# Patient Record
Sex: Female | Born: 1985 | Race: Black or African American | Hispanic: Yes | Smoking: Never smoker
Health system: Southern US, Community
[De-identification: ages and names within clinical notes are randomized; demographics above are authoritative.]

## PROBLEM LIST (undated history)

## (undated) DIAGNOSIS — T783XXA Angioneurotic edema, initial encounter: Secondary | ICD-10-CM

## (undated) DIAGNOSIS — K219 Gastro-esophageal reflux disease without esophagitis: Secondary | ICD-10-CM

## (undated) DIAGNOSIS — N63 Unspecified lump in unspecified breast: Secondary | ICD-10-CM

## (undated) DIAGNOSIS — G43909 Migraine, unspecified, not intractable, without status migrainosus: Secondary | ICD-10-CM

## (undated) DIAGNOSIS — U071 COVID-19: Secondary | ICD-10-CM

## (undated) DIAGNOSIS — J45909 Unspecified asthma, uncomplicated: Secondary | ICD-10-CM

## (undated) DIAGNOSIS — Z973 Presence of spectacles and contact lenses: Secondary | ICD-10-CM

## (undated) DIAGNOSIS — R011 Cardiac murmur, unspecified: Secondary | ICD-10-CM

## (undated) HISTORY — DX: Angioneurotic edema, initial encounter: T78.3XXA

## (undated) HISTORY — DX: Unspecified lump in unspecified breast: N63.0

## (undated) HISTORY — DX: Unspecified asthma, uncomplicated: J45.909

## (undated) HISTORY — DX: Gastro-esophageal reflux disease without esophagitis: K21.9

## (undated) HISTORY — DX: Migraine, unspecified, not intractable, without status migrainosus: G43.909

---

## 2015-01-11 HISTORY — PX: APPENDECTOMY: SHX54

## 2015-08-26 ENCOUNTER — Ambulatory Visit (INDEPENDENT_AMBULATORY_CARE_PROVIDER_SITE_OTHER): Payer: Self-pay | Admitting: Family Medicine

## 2015-08-26 ENCOUNTER — Encounter: Payer: Self-pay | Admitting: Family Medicine

## 2015-08-26 VITALS — BP 110/80 | Temp 99.0°F | Wt 162.0 lb

## 2015-08-26 DIAGNOSIS — K1379 Other lesions of oral mucosa: Secondary | ICD-10-CM

## 2015-08-26 DIAGNOSIS — K1370 Unspecified lesions of oral mucosa: Secondary | ICD-10-CM | POA: Insufficient documentation

## 2015-08-26 MED ORDER — ACYCLOVIR 400 MG PO TABS: 400.0000 mg | ORAL_TABLET | Freq: Every day | ORAL | Status: DC

## 2015-08-26 MED ORDER — VALACYCLOVIR HCL 1 G PO TABS: 1000.0000 mg | ORAL_TABLET | Freq: Two times a day (BID) | ORAL | Status: DC

## 2015-08-26 NOTE — Progress Notes (Signed)
   Subjective:    Patient ID: Griselda MinerCarmen Toppin, female    DOB: 03-Nov-1985, 29 y.o.   MRN: [id number]  HPI [name] is a 29 year old married female nonsmoker who comes in today for evaluation of a painful lesion on the upper palate for 4 days  She states she felt well until Thursday evening she gets fever and chills the next day noticed some painful ulcerated lesions on the upper palate just behind her front teeth. After 24-hour the fever and chills went away the lesions persist. She's never had a problem like this in the past  She developed no other symptoms i.e. no sore throat head congestion runny nose or cough  She's had an appendectomy otherwise no major illnesses or injuries. She has no medical allergies , she takes no medications. For birth control she has a Mirena IUD       Review of Systems    otherwise negative Objective:   Physical Exam  Well-developed well-nourished female no acute distress vital signs stable she's afebrile examination the oral cavity shows some ulcerated lesions on the upper palate just behind the front teeth  Disuse cervical adenopathy      Assessment & Plan:  Painful lesions on the upper palate...Marland Kitchen.Marland Kitchen.Marland Kitchen. first episode.......Marland Kitchen. 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 culture taken,,,,,, acyclovir prescribed

## 2015-08-27 ENCOUNTER — Other Ambulatory Visit: Payer: Self-pay | Admitting: *Deleted

## 2015-08-27 MED ORDER — MAGIC MOUTHWASH W/LIDOCAINE: 5.0000 mL | ORAL | Status: DC | PRN

## 2015-08-31 LAB — RFX HSV/VARICELLA ZOSTER RAPID CULT

## 2015-08-31 LAB — REFLEX ADENOVIRUS CULTURE

## 2015-09-03 LAB — CYTOMEGALOVIRUS CULTURE

## 2015-09-03 LAB — VIRAL CULTURE VIRC

## 2015-10-16 ENCOUNTER — Other Ambulatory Visit: Payer: Self-pay | Admitting: Family Medicine

## 2015-10-16 DIAGNOSIS — Z0000 Encounter for general adult medical examination without abnormal findings: Secondary | ICD-10-CM

## 2015-10-16 DIAGNOSIS — R5383 Other fatigue: Secondary | ICD-10-CM

## 2015-10-17 ENCOUNTER — Other Ambulatory Visit (INDEPENDENT_AMBULATORY_CARE_PROVIDER_SITE_OTHER): Payer: Self-pay

## 2015-10-17 ENCOUNTER — Other Ambulatory Visit: Payer: Self-pay | Admitting: Family Medicine

## 2015-10-17 DIAGNOSIS — Z0000 Encounter for general adult medical examination without abnormal findings: Secondary | ICD-10-CM

## 2015-10-17 DIAGNOSIS — R5383 Other fatigue: Secondary | ICD-10-CM

## 2015-10-17 LAB — LIPID PANEL
Cholesterol: 169 mg/dL (ref 0–200)
HDL: 48.4 mg/dL
LDL Cholesterol: 99 mg/dL (ref 0–99)
NonHDL: 121.05
Total CHOL/HDL Ratio: 4
Triglycerides: 112 mg/dL (ref 0.0–149.0)
VLDL: 22.4 mg/dL (ref 0.0–40.0)

## 2015-10-17 LAB — BASIC METABOLIC PANEL WITH GFR
BUN: 14 mg/dL (ref 6–23)
CO2: 29 meq/L (ref 19–32)
Calcium: 9.6 mg/dL (ref 8.4–10.5)
Chloride: 102 meq/L (ref 96–112)
Creatinine, Ser: 0.78 mg/dL (ref 0.40–1.20)
GFR: 93.16 mL/min
Glucose, Bld: 94 mg/dL (ref 70–99)
Potassium: 4.4 meq/L (ref 3.5–5.1)
Sodium: 139 meq/L (ref 135–145)

## 2015-10-17 LAB — CBC WITH DIFFERENTIAL/PLATELET
Basophils Absolute: 0 K/uL (ref 0.0–0.1)
Basophils Relative: 0.4 % (ref 0.0–3.0)
Eosinophils Absolute: 0.1 K/uL (ref 0.0–0.7)
Eosinophils Relative: 1.7 % (ref 0.0–5.0)
HCT: 41 % (ref 36.0–46.0)
Hemoglobin: 13.8 g/dL (ref 12.0–15.0)
Lymphocytes Relative: 26.4 % (ref 12.0–46.0)
Lymphs Abs: 1.8 K/uL (ref 0.7–4.0)
MCHC: 33.5 g/dL (ref 30.0–36.0)
MCV: 90.4 fl (ref 78.0–100.0)
Monocytes Absolute: 0.5 K/uL (ref 0.1–1.0)
Monocytes Relative: 7 % (ref 3.0–12.0)
Neutro Abs: 4.5 K/uL (ref 1.4–7.7)
Neutrophils Relative %: 64.5 % (ref 43.0–77.0)
Platelets: 211 K/uL (ref 150.0–400.0)
RBC: 4.54 Mil/uL (ref 3.87–5.11)
RDW: 13.2 % (ref 11.5–15.5)
WBC: 7 K/uL (ref 4.0–10.5)

## 2015-10-17 LAB — POCT URINALYSIS DIPSTICK
Bilirubin, UA: NEGATIVE
Blood, UA: NEGATIVE
Glucose, UA: NEGATIVE
Ketones, UA: NEGATIVE
Leukocytes, UA: NEGATIVE
Nitrite, UA: NEGATIVE
Protein, UA: NEGATIVE
Spec Grav, UA: >=1.03
Urobilinogen, UA: 0.2
pH, UA: 5.5

## 2015-10-17 LAB — VITAMIN D 25 HYDROXY (VIT D DEFICIENCY, FRACTURES): VITD: 17.56 ng/mL — ABNORMAL LOW (ref 30.00–100.00)

## 2015-10-17 LAB — HEPATIC FUNCTION PANEL
ALK PHOS: 77 U/L (ref 39–117)
ALT: 12 U/L (ref 0–35)
AST: 14 U/L (ref 0–37)
Albumin: 4.3 g/dL (ref 3.5–5.2)
BILIRUBIN DIRECT: 0.1 mg/dL (ref 0.0–0.3)
BILIRUBIN TOTAL: 0.4 mg/dL (ref 0.2–1.2)
TOTAL PROTEIN: 7.1 g/dL (ref 6.0–8.3)

## 2015-10-17 LAB — TSH: TSH: 1.88 u[IU]/mL (ref 0.35–4.50)

## 2015-10-21 ENCOUNTER — Encounter: Payer: Self-pay | Admitting: Family Medicine

## 2015-10-21 ENCOUNTER — Ambulatory Visit (INDEPENDENT_AMBULATORY_CARE_PROVIDER_SITE_OTHER): Payer: 59 | Admitting: Family Medicine

## 2015-10-21 VITALS — BP 110/78 | Temp 98.9°F | Ht 64.0 in | Wt 162.0 lb

## 2015-10-21 DIAGNOSIS — J302 Other seasonal allergic rhinitis: Secondary | ICD-10-CM | POA: Diagnosis not present

## 2015-10-21 DIAGNOSIS — J309 Allergic rhinitis, unspecified: Secondary | ICD-10-CM | POA: Insufficient documentation

## 2015-10-21 MED ORDER — FLUTICASONE PROPIONATE 50 MCG/ACT NA SUSP: 2.0000 | Freq: Every day | NASAL | Status: DC

## 2015-10-21 NOTE — Progress Notes (Signed)
   Subjective:    Patient ID: Samantha Hartman, female    DOB: 09/01/1986, 30 y.o.   MRN: [id number]  HPI Samantha Hartman is a 30 year old female nonsmoker who comes in today for evaluation of 2 issues  Past 2 months she's had a sense of fatigue. The last 5 days she's had head congestion runny nose and sneezing. She has a Mirena IUD. LMP was a week ago. Typically she doesn't have a cycle on the Mirena but she did have one last week  She got some lab work 4 days ago. Lab work showed a normal urine CBC LFTs cholesterol thyroid and metabolic panel.     Review of Systems Review of systems otherwise negative no history of previous allergy problems in the past    Objective:   Physical Exam Well-developed well-nourished female no acute distress vital signs stable she's afebrile HEENT were negative except for marked bilateral nasal edema allergic rhinitis............Marland Kitchen.       Assessment & Plan:  Allergic rhinitis........Marland Kitchen. Zyrtec plain a bedtime and steroid nasal spray

## 2015-10-21 NOTE — Patient Instructions (Signed)
Zyrtec plain........Marland Kitchen. 1 at bedtime  Steroid nasal spray.......Marland Kitchen. 1 shot up each nostril twice daily  Follow-up with me in 2 weeks to give me a progress report

## 2015-10-21 NOTE — Progress Notes (Signed)
Pre visit review using our clinic review tool, if applicable. No additional management support is needed unless otherwise documented below in the visit note. 

## 2015-10-30 ENCOUNTER — Encounter: Payer: Self-pay | Admitting: Family Medicine

## 2015-10-30 ENCOUNTER — Ambulatory Visit (INDEPENDENT_AMBULATORY_CARE_PROVIDER_SITE_OTHER): Payer: 59 | Admitting: Family Medicine

## 2015-10-30 VITALS — BP 110/80 | Temp 98.1°F | Wt 162.0 lb

## 2015-10-30 DIAGNOSIS — N764 Abscess of vulva: Secondary | ICD-10-CM | POA: Insufficient documentation

## 2015-10-30 MED ORDER — CEPHALEXIN 500 MG PO CAPS: 500.0000 mg | ORAL_CAPSULE | Freq: Four times a day (QID) | ORAL | Status: DC

## 2015-10-30 NOTE — Progress Notes (Signed)
   Subjective:    Patient ID: Samantha Hartman, female    DOB: 05-01-1986, 30 y.o.   MRN: [id number]  HPI Mattea is a 30 year old female who comes in today for evaluation of pain swelling and redness of her left vulva  In [place] a year ago she had 3 surgical procedures 3 months apart because of her current abscess in the left vulva. It resolved and she's been symptom-free for the past year until yesterday when she noticed pain and swelling and redness and some drainage.  Review of systems otherwise negative LMP skin. 2 weeks ago she has the Mirena IUD    Review of Systems    this systems otherwise negative Objective:   Physical Exam Well-developed well-nourished female no acute distress vital signs stable she's afebrile examination vulva shows a golf ball size abscess with a area that's pointing. After an alcohol prep a small medial was used to open the abscess. Pus was expressed and cultured.       Assessment & Plan:  Vulvar abscess................ hot soaks antibiotics follow-up in one week

## 2015-10-30 NOTE — Patient Instructions (Signed)
Sits baths twice daily  Keflex 500 mg........ 2 tabs twice daily  Follow-up next Monday

## 2015-11-02 LAB — WOUND CULTURE

## 2015-11-04 ENCOUNTER — Ambulatory Visit (INDEPENDENT_AMBULATORY_CARE_PROVIDER_SITE_OTHER): Payer: 59 | Admitting: Family Medicine

## 2015-11-04 ENCOUNTER — Encounter: Payer: Self-pay | Admitting: Family Medicine

## 2015-11-04 DIAGNOSIS — N764 Abscess of vulva: Secondary | ICD-10-CM

## 2015-11-04 MED ORDER — FLUCONAZOLE 150 MG PO TABS
150.0000 mg | ORAL_TABLET | Freq: Once | ORAL | Status: DC
Start: 2015-11-04 — End: 2015-11-15

## 2015-11-04 NOTE — Patient Instructions (Signed)
Finished the antibiotics.........Marland Kitchen 1 a day at bedtime until bottle empty  Diflucan.......Marland Kitchen 1 now and repeat in 3-4 days of vaginitis persists  Return when necessary

## 2015-11-04 NOTE — Progress Notes (Signed)
   Subjective:    Patient ID: Samantha Hartman, female    DOB: 1986/06/21, 30 y.o.   MRN: [id number]  HPI Samantha Hartman is a 30 year old female nonsmoker who comes in today for follow-up of a vulvar abscess  We saw a week ago with an abscess on her left labia. She had this problem about a year ago and had 3 surgical procedures over a six-month period of time. It then recurred. When we saw her last week a little bit of pus was expressed and we started her on Keflex 500 mg 2 tabs twice a day. Within a couple days the swelling and redness diminished in the pain diminished. Continue to drain for couple more days.  Culture report shows gram-positive bacteria   Review of Systems    review of systems negative Objective:   Physical Exam Well-developed well-nourished female no acute distress vital signs stable she's afebrile examination of labia shows the area of the abscess is pretty much gone. I can feel no tenderness no pus. There are some scar tissue where she had the incision and drainage last year.       Assessment & Plan:  Left lower abscess.......Marland Kitchen Resolved.......Marland Kitchen finish Keflex 1 tablet a day at bedtime until bottle empty.......Marland Kitchen Diflucan for vaginitis  In the future do not shave with a razor.

## 2015-11-15 ENCOUNTER — Ambulatory Visit (INDEPENDENT_AMBULATORY_CARE_PROVIDER_SITE_OTHER)
Admission: RE | Admit: 2015-11-15 | Discharge: 2015-11-15 | Disposition: A | Discharge status: Home / Self Care | Payer: 59 | Source: Ambulatory Visit | Attending: Adult Health | Admitting: Adult Health

## 2015-11-15 ENCOUNTER — Ambulatory Visit (INDEPENDENT_AMBULATORY_CARE_PROVIDER_SITE_OTHER): Payer: 59 | Admitting: Adult Health

## 2015-11-15 ENCOUNTER — Encounter: Payer: Self-pay | Admitting: Adult Health

## 2015-11-15 VITALS — HR 78 | Temp 98.7°F | Ht 64.0 in | Wt 162.0 lb

## 2015-11-15 DIAGNOSIS — R8290 Unspecified abnormal findings in urine: Secondary | ICD-10-CM

## 2015-11-15 DIAGNOSIS — R079 Chest pain, unspecified: Secondary | ICD-10-CM

## 2015-11-15 DIAGNOSIS — R8299 Other abnormal findings in urine: Secondary | ICD-10-CM | POA: Diagnosis not present

## 2015-11-15 LAB — CBC
HEMATOCRIT: 41.2 % (ref 36.0–46.0)
HEMOGLOBIN: 13.8 g/dL (ref 12.0–15.0)
MCHC: 33.5 g/dL (ref 30.0–36.0)
MCV: 90.6 fl (ref 78.0–100.0)
PLATELETS: 217 10*3/uL (ref 150.0–400.0)
RBC: 4.55 Mil/uL (ref 3.87–5.11)
RDW: 13.4 % (ref 11.5–15.5)
WBC: 6.8 10*3/uL (ref 4.0–10.5)

## 2015-11-15 LAB — POCT URINALYSIS DIPSTICK
Bilirubin, UA: NEGATIVE
Blood, UA: NEGATIVE
GLUCOSE UA: NEGATIVE
KETONES UA: NEGATIVE
NITRITE UA: NEGATIVE
PH UA: 6
PROTEIN UA: NEGATIVE
SPEC GRAV UA: 1.015
UROBILINOGEN UA: 0.2

## 2015-11-15 LAB — BASIC METABOLIC PANEL
BUN: 7 mg/dL (ref 6–23)
CHLORIDE: 105 meq/L (ref 96–112)
CO2: 26 mEq/L (ref 19–32)
Calcium: 9.7 mg/dL (ref 8.4–10.5)
Creatinine, Ser: 0.71 mg/dL (ref 0.40–1.20)
GFR: 103.78 mL/min (ref >60.00)
GLUCOSE: 86 mg/dL (ref 70–99)
POTASSIUM: 4.4 meq/L (ref 3.5–5.1)
SODIUM: 138 meq/L (ref 135–145)

## 2015-11-15 LAB — POCT URINE PREGNANCY: Preg Test, Ur: NEGATIVE

## 2015-11-15 LAB — TSH: TSH: 1.24 u[IU]/mL (ref 0.35–4.50)

## 2015-11-15 LAB — TROPONIN I: TNIDX: 0 ug/l (ref 0.00–0.06)

## 2015-11-15 MED ORDER — CYCLOBENZAPRINE HCL 10 MG PO TABS: 10.0000 mg | ORAL_TABLET | Freq: Three times a day (TID) | ORAL | Status: DC | PRN

## 2015-11-15 MED ORDER — KETOROLAC TROMETHAMINE 60 MG/2ML IM SOLN
60.0000 mg | Freq: Once | INTRAMUSCULAR | Status: AC
  Administered 2015-11-15: 60 mg via INTRAMUSCULAR

## 2015-11-15 NOTE — Progress Notes (Signed)
Pre visit review using our clinic review tool, if applicable. No additional management support is needed unless otherwise documented below in the visit note. 

## 2015-11-15 NOTE — Progress Notes (Signed)
Subjective:    Patient ID: Samantha Hartman, female    DOB: [birth year], 30 y.o.   MRN: [id number]  HPI 30 year old female who presents to the office today for less than 24 hours of non specific sharp chest pain. Her chest pain started last night around 8:30 pm while laying in bed.   She reports that when she is sitting straight up the pain is a 7/10. When she lays down the pain goes up to a 10/10 and she has a hard time breathing.   When she woke up this morning she was nauseated and vomited. She continues to feel nauseated throughout the morning. She does endorse waking up with a sour taste in her mouth but has been taking Prilosec for years.  She denies any burning pain in her chest but does on occasion have pain in her esophageus.   Her only surgery is that of an appendectomy. She is on OTC allergy medication and has a Mirana IUD.    Past Medical History  Diagnosis Date  . Asthma   . GERD (gastroesophageal reflux disease)     Social History   Social History  . Marital Status: Married    Spouse Name: N/A  . Number of Children: N/A  . Years of Education: N/A   Occupational History  . Not on file.   Social History Main Topics  . Smoking status: Never Smoker   . Smokeless tobacco: Not on file  . Alcohol Use: Not on file  . Drug Use: Not on file  . Sexual Activity: Not on file   Other Topics Concern  . Not on file   Social History Narrative    Past Surgical History  Procedure Laterality Date  . Appendectomy      Family History  Problem Relation Age of Onset  . Stroke Mother   . Hypertension Mother   . Cancer Father     prostate    No Known Allergies  Current Outpatient Prescriptions on File Prior to Visit  Medication Sig Dispense Refill  . cetirizine (ZYRTEC) 10 MG tablet Take 10 mg by mouth daily.    . fluticasone (FLONASE) 50 MCG/ACT nasal spray Place 2 sprays into both nostrils daily. 32 g 6   No current facility-administered medications on file prior  to visit.    Pulse 78  Temp(Src) 98.7 F (37.1 C) (Oral)  Ht  (1.626 m)  Wt 162 lb (73.483 kg)  BMI 27.79 kg/m2  SpO2 98%    Review of Systems  Constitutional: Negative.   Respiratory: Positive for chest tightness and shortness of breath.   Cardiovascular: Positive for chest pain. Negative for palpitations and leg swelling.  Gastrointestinal: Positive for nausea and vomiting.  Neurological: Negative.   All other systems reviewed and are negative.  Past Medical History  Diagnosis Date  . Asthma   . GERD (gastroesophageal reflux disease)     Social History   Social History  . Marital Status: Married    Spouse Name: N/A  . Number of Children: N/A  . Years of Education: N/A   Occupational History  . Not on file.   Social History Main Topics  . Smoking status: Never Smoker   . Smokeless tobacco: Not on file  . Alcohol Use: Not on file  . Drug Use: Not on file  . Sexual Activity: Not on file   Other Topics Concern  . Not on file   Social History Narrative    Past Surgical  History  Procedure Laterality Date  . Appendectomy      Family History  Problem Relation Age of Onset  . Stroke Mother   . Hypertension Mother   . Cancer Father     prostate    No Known Allergies  Current Outpatient Prescriptions on File Prior to Visit  Medication Sig Dispense Refill  . cetirizine (ZYRTEC) 10 MG tablet Take 10 mg by mouth daily.    . fluticasone (FLONASE) 50 MCG/ACT nasal spray Place 2 sprays into both nostrils daily. 32 g 6   No current facility-administered medications on file prior to visit.    Pulse 78  Temp(Src) 98.7 F (37.1 C) (Oral)  Ht  (1.626 m)  Wt 162 lb (73.483 kg)  BMI 27.79 kg/m2  SpO2 98%       Objective:   Physical Exam  Constitutional: She is oriented to person, place, and time. She appears well-developed and well-nourished. No distress.  Neck: Normal range of motion. Neck supple.  Cardiovascular: Normal rate, regular  rhythm, normal heart sounds and intact distal pulses.  Exam reveals no gallop and no friction rub.   No murmur heard. EKG showed occasional ectopic ventricular beats  Pulmonary/Chest: Effort normal and breath sounds normal. No respiratory distress. She has no wheezes. She has no rales. She exhibits no tenderness.  Musculoskeletal: Normal range of motion. She exhibits no edema or tenderness.  Lymphadenopathy:    She has no cervical adenopathy.  Neurological: She is alert and oriented to person, place, and time.  Skin: Skin is warm and dry. No rash noted. She is not diaphoretic. No erythema. No pallor.  Psychiatric: She has a normal mood and affect. Her behavior is normal. Judgment and thought content normal.  Nursing note and vitals reviewed.     Assessment & Plan:  1. Chest pain, unspecified chest pain type - Differentials include GERD, PE, pneumothorax, pneumonia, costocondoritis. She does not smoke and does not take oral birth control. I doubt any cardiac issues - EKG 12-Lead - NSR with ectopic ventricular beats. Rate 82 - DG Chest 2 View; Future - Negative  - Basic metabolic panel - CBC - TSH - POCT urinalysis dipstick - trace Leuks - POCT urine pregnancy- Negative - ketorolac (TORADOL) injection 60 mg; Inject 2 mLs (60 mg total) into the muscle - cyclobenzaprine (FLEXERIL) 10 MG tablet; Take 1 tablet (10 mg total) by mouth 3 (three) times daily as needed for muscle spasms.  Dispense: 30 tablet; Refill: 1

## 2015-11-17 LAB — URINE CULTURE
Colony Count: NO GROWTH
Organism ID, Bacteria: NO GROWTH

## 2015-11-25 ENCOUNTER — Ambulatory Visit: Payer: 59 | Admitting: Family Medicine

## 2015-11-28 DIAGNOSIS — Z6827 Body mass index (BMI) 27.0-27.9, adult: Secondary | ICD-10-CM | POA: Diagnosis not present

## 2015-11-28 DIAGNOSIS — Z01419 Encounter for gynecological examination (general) (routine) without abnormal findings: Secondary | ICD-10-CM | POA: Diagnosis not present

## 2015-12-02 ENCOUNTER — Other Ambulatory Visit: Payer: Self-pay | Admitting: Obstetrics and Gynecology

## 2015-12-02 DIAGNOSIS — N644 Mastodynia: Secondary | ICD-10-CM

## 2015-12-02 DIAGNOSIS — N6452 Nipple discharge: Secondary | ICD-10-CM

## 2015-12-02 DIAGNOSIS — N63 Unspecified lump in unspecified breast: Secondary | ICD-10-CM

## 2015-12-04 ENCOUNTER — Ambulatory Visit
Admission: RE | Admit: 2015-12-04 | Discharge: 2015-12-04 | Disposition: A | Discharge status: Home / Self Care | Payer: 59 | Source: Ambulatory Visit | Attending: Obstetrics and Gynecology | Admitting: Obstetrics and Gynecology

## 2015-12-04 DIAGNOSIS — N6452 Nipple discharge: Secondary | ICD-10-CM

## 2015-12-04 DIAGNOSIS — N63 Unspecified lump in unspecified breast: Secondary | ICD-10-CM

## 2015-12-04 DIAGNOSIS — N644 Mastodynia: Secondary | ICD-10-CM

## 2015-12-04 NOTE — H&P (Signed)
  Patient name Samantha Hartman, Samantha Hartman DICTATION# [id number] CSN#  [id number]  [facility], MD 12/04/2015 8:26 AM

## 2015-12-04 NOTE — H&P (Signed)
NAME:  Samantha Hartman, GRANGE NO.:  [id number]  MEDICAL RECORD NO.:  [id number]  LOCATION:                                 FACILITY:  PHYSICIAN:  Juluis Mire, M.D.   DATE OF BIRTH:  [birth year]  DATE OF ADMISSION:  12/19/2015 DATE OF DISCHARGE:                             HISTORY & PHYSICAL   DATE OF SURGERY:  March 9th at [facility], [place].  HISTORY OF PRESENT ILLNESS:  The patient is a 30 year old, gravida 3, para 2, divorced female, who presents for permanent sterilization.  She had an IUD inserted in 2014 is basically amenorrheic.  She is desirous of permanent sterilization at this point in time, who presents for laparoscopic bilateral tubal ligation.  ALLERGIES:  In terms of allergies, she has no known drug allergies.  MEDICATIONS:  None.  PAST MEDICAL HISTORY:  Usual childhood diseases.  No significant sequelae.  She has had 2 vaginal deliveries.  SOCIAL HISTORY:  Reveals no tobacco or alcohol use.  FAMILY HISTORY:  Noncontributory.  REVIEW OF SYSTEMS:  Noncontributory.  PHYSICAL EXAMINATION:  VITAL SIGNS:  The patient is afebrile.  Stable vital signs. HEENT:  The patient is normocephalic.  Pupils equal, round, and reactive to light and accommodation.  Extraocular movements were intact.  Sclerae and conjunctivae were clear.  Oropharynx was clear. NECK:  Without thyromegaly. BREASTS:  Not examined. LUNGS:  Clear. CARDIOVASCULAR:  Regular rate without murmurs or gallops.  No carotid or abdominal bruits. ABDOMEN:  Benign.  No mass, organomegaly, or tenderness. PELVIC:  Normal external genitalia.  Vaginal mucosa is clear.  Cervix unremarkable.  Uterus normal size, shape, and contour.  Adnexa free of mass or tenderness. EXTREMITIES:  Trace edema. NEUROLOGIC:  Gross normal limits.  IMPRESSION:  Multiparity desires sterility.  PLAN:  The patient will undergo laparoscopic bilateral tubal fulguration.  The  potential irreversibility of sterilization was explained.  A failure rate of 1 in 200 is quoted.  Failures can be in the form of ectopic pregnancy, requiring further surgical management. The risks of surgery have been explained including the risk of infection. The risk of hemorrhage that could require transfusion with the risk of AIDS or hepatitis.  Risk of injury to adjacent organs such as bladder, bowel, or ureters that could require further exploratory surgery.  Risk of deep venous thrombosis and pulmonary embolus.  The patient does understand the indications, risks, and alternatives.     Juluis Mire, M.D.     JSM/MEDQ  D:  12/04/2015  T:  12/04/2015  Job:  [id number]

## 2015-12-12 ENCOUNTER — Encounter (HOSPITAL_BASED_OUTPATIENT_CLINIC_OR_DEPARTMENT_OTHER): Payer: Self-pay | Admitting: *Deleted

## 2015-12-12 ENCOUNTER — Other Ambulatory Visit (INDEPENDENT_AMBULATORY_CARE_PROVIDER_SITE_OTHER): Payer: 59

## 2015-12-12 DIAGNOSIS — E349 Endocrine disorder, unspecified: Secondary | ICD-10-CM

## 2015-12-12 DIAGNOSIS — D649 Anemia, unspecified: Secondary | ICD-10-CM

## 2015-12-12 LAB — CBC WITH DIFFERENTIAL/PLATELET
BASOS ABS: 0 10*3/uL (ref 0.0–0.1)
BASOS PCT: 0.4 % (ref 0.0–3.0)
Eosinophils Absolute: 0.1 10*3/uL (ref 0.0–0.7)
Eosinophils Relative: 1.4 % (ref 0.0–5.0)
HCT: 38.8 % (ref 36.0–46.0)
Hemoglobin: 13.3 g/dL (ref 12.0–15.0)
LYMPHS ABS: 2.3 10*3/uL (ref 0.7–4.0)
Lymphocytes Relative: 34 % (ref 12.0–46.0)
MCHC: 34.2 g/dL (ref 30.0–36.0)
MCV: 89 fl (ref 78.0–100.0)
MONOS PCT: 7 % (ref 3.0–12.0)
Monocytes Absolute: 0.5 10*3/uL (ref 0.1–1.0)
NEUTROS ABS: 3.9 10*3/uL (ref 1.4–7.7)
NEUTROS PCT: 57.2 % (ref 43.0–77.0)
PLATELETS: 207 10*3/uL (ref 150.0–400.0)
RBC: 4.36 Mil/uL (ref 3.87–5.11)
RDW: 13.1 % (ref 11.5–15.5)
WBC: 6.8 10*3/uL (ref 4.0–10.5)

## 2015-12-12 NOTE — Progress Notes (Addendum)
NPO AFTER MN.  ARRIVE AT 0600.  PT WORKS AT [facility], SHE WILL BACK TO ME IF  SHE CAN LAB WORK DONE THERE.  RECEIVED CALL BACK FROM PT, AND SHE HAD LAB WORK DONE TODAY.  RESULTS IN CHART AND EPIC.

## 2015-12-13 LAB — HCG, SERUM, QUALITATIVE: Preg, Serum: NEGATIVE

## 2015-12-16 DIAGNOSIS — Z302 Encounter for sterilization: Secondary | ICD-10-CM | POA: Diagnosis not present

## 2015-12-16 DIAGNOSIS — N63 Unspecified lump in breast: Secondary | ICD-10-CM | POA: Diagnosis not present

## 2015-12-16 DIAGNOSIS — Z01818 Encounter for other preprocedural examination: Secondary | ICD-10-CM | POA: Diagnosis not present

## 2015-12-19 ENCOUNTER — Ambulatory Visit (HOSPITAL_BASED_OUTPATIENT_CLINIC_OR_DEPARTMENT_OTHER): Payer: 59 | Admitting: Anesthesiology

## 2015-12-19 ENCOUNTER — Encounter (HOSPITAL_BASED_OUTPATIENT_CLINIC_OR_DEPARTMENT_OTHER)
Admission: RE | Disposition: A | Discharge status: Home / Self Care | Payer: Self-pay | Source: Ambulatory Visit | Attending: Obstetrics and Gynecology

## 2015-12-19 ENCOUNTER — Encounter (HOSPITAL_BASED_OUTPATIENT_CLINIC_OR_DEPARTMENT_OTHER): Payer: Self-pay | Admitting: *Deleted

## 2015-12-19 ENCOUNTER — Ambulatory Visit (HOSPITAL_BASED_OUTPATIENT_CLINIC_OR_DEPARTMENT_OTHER)
Admission: RE | Admit: 2015-12-19 | Discharge: 2015-12-19 | Disposition: A | Discharge status: Home / Self Care | Payer: 59 | Source: Ambulatory Visit | Attending: Obstetrics and Gynecology | Admitting: Obstetrics and Gynecology

## 2015-12-19 DIAGNOSIS — Z302 Encounter for sterilization: Secondary | ICD-10-CM | POA: Diagnosis not present

## 2015-12-19 DIAGNOSIS — Z975 Presence of (intrauterine) contraceptive device: Secondary | ICD-10-CM | POA: Diagnosis not present

## 2015-12-19 HISTORY — DX: Presence of spectacles and contact lenses: Z97.3

## 2015-12-19 HISTORY — PX: LAPAROSCOPIC TUBAL LIGATION: SHX1937

## 2015-12-19 SURGERY — LIGATION, FALLOPIAN TUBE, LAPAROSCOPIC
Anesthesia: General | Laterality: Bilateral

## 2015-12-19 MED ORDER — CEFAZOLIN SODIUM-DEXTROSE 2-3 GM-% IV SOLR
INTRAVENOUS | Status: AC
  Filled 2015-12-19: qty 50

## 2015-12-19 MED ORDER — FENTANYL CITRATE (PF) 100 MCG/2ML IJ SOLN
INTRAMUSCULAR | Status: AC
  Filled 2015-12-19: qty 2

## 2015-12-19 MED ORDER — MIDAZOLAM HCL 2 MG/2ML IJ SOLN
INTRAMUSCULAR | Status: AC
  Filled 2015-12-19: qty 2

## 2015-12-19 MED ORDER — PROMETHAZINE HCL 25 MG/ML IJ SOLN
6.2500 mg | INTRAMUSCULAR | Status: DC | PRN
  Filled 2015-12-19: qty 1

## 2015-12-19 MED ORDER — GLYCOPYRROLATE 0.2 MG/ML IJ SOLN
INTRAMUSCULAR | Status: DC | PRN
  Administered 2015-12-19: 0.4 mg via INTRAVENOUS

## 2015-12-19 MED ORDER — ONDANSETRON HCL 4 MG/2ML IJ SOLN
INTRAMUSCULAR | Status: DC | PRN
  Administered 2015-12-19: 4 mg via INTRAVENOUS

## 2015-12-19 MED ORDER — WHITE PETROLATUM GEL
Status: AC
Start: 2015-12-19 — End: 2015-12-19
  Filled 2015-12-19: qty 5

## 2015-12-19 MED ORDER — CEFAZOLIN SODIUM-DEXTROSE 2-3 GM-% IV SOLR
2.0000 g | INTRAVENOUS | Status: AC
  Administered 2015-12-19: 2 g via INTRAVENOUS
  Filled 2015-12-19: qty 50

## 2015-12-19 MED ORDER — KETOROLAC TROMETHAMINE 30 MG/ML IJ SOLN
INTRAMUSCULAR | Status: AC
  Filled 2015-12-19: qty 1

## 2015-12-19 MED ORDER — SUCCINYLCHOLINE CHLORIDE 20 MG/ML IJ SOLN
INTRAMUSCULAR | Status: DC | PRN
  Administered 2015-12-19: 100 mg via INTRAVENOUS

## 2015-12-19 MED ORDER — SUGAMMADEX SODIUM 200 MG/2ML IV SOLN
INTRAVENOUS | Status: DC | PRN
  Administered 2015-12-19 (×2): 100 mg via INTRAVENOUS

## 2015-12-19 MED ORDER — OXYCODONE-ACETAMINOPHEN 7.5-325 MG PO TABS: 1.0000 | ORAL_TABLET | ORAL | Status: DC | PRN

## 2015-12-19 MED ORDER — ROCURONIUM BROMIDE 100 MG/10ML IV SOLN
INTRAVENOUS | Status: AC
  Filled 2015-12-19: qty 1

## 2015-12-19 MED ORDER — SUGAMMADEX SODIUM 200 MG/2ML IV SOLN
INTRAVENOUS | Status: AC
  Filled 2015-12-19: qty 4

## 2015-12-19 MED ORDER — DEXAMETHASONE SODIUM PHOSPHATE 4 MG/ML IJ SOLN
INTRAMUSCULAR | Status: DC | PRN
  Administered 2015-12-19: 10 mg via INTRAVENOUS

## 2015-12-19 MED ORDER — MIDAZOLAM HCL 5 MG/5ML IJ SOLN
INTRAMUSCULAR | Status: DC | PRN
  Administered 2015-12-19: 2 mg via INTRAVENOUS

## 2015-12-19 MED ORDER — FENTANYL CITRATE (PF) 100 MCG/2ML IJ SOLN
25.0000 ug | INTRAMUSCULAR | Status: DC | PRN
  Administered 2015-12-19: 50 ug via INTRAVENOUS
  Filled 2015-12-19: qty 1

## 2015-12-19 MED ORDER — NEOSTIGMINE METHYLSULFATE 10 MG/10ML IV SOLN
INTRAVENOUS | Status: DC | PRN
  Administered 2015-12-19: 3 mg via INTRAVENOUS

## 2015-12-19 MED ORDER — LIDOCAINE HCL (CARDIAC) 20 MG/ML IV SOLN
INTRAVENOUS | Status: AC
  Filled 2015-12-19: qty 5

## 2015-12-19 MED ORDER — FENTANYL CITRATE (PF) 100 MCG/2ML IJ SOLN
INTRAMUSCULAR | Status: DC | PRN
  Administered 2015-12-19 (×2): 25 ug via INTRAVENOUS
  Administered 2015-12-19: 50 ug via INTRAVENOUS

## 2015-12-19 MED ORDER — LIDOCAINE HCL (CARDIAC) 20 MG/ML IV SOLN
INTRAVENOUS | Status: DC | PRN
  Administered 2015-12-19: 60 mg via INTRAVENOUS

## 2015-12-19 MED ORDER — PROPOFOL 10 MG/ML IV BOLUS
INTRAVENOUS | Status: AC
  Filled 2015-12-19: qty 20

## 2015-12-19 MED ORDER — GLYCOPYRROLATE 0.2 MG/ML IJ SOLN
INTRAMUSCULAR | Status: AC
  Filled 2015-12-19: qty 2

## 2015-12-19 MED ORDER — PROPOFOL 10 MG/ML IV BOLUS
INTRAVENOUS | Status: DC | PRN
  Administered 2015-12-19: 200 mg via INTRAVENOUS

## 2015-12-19 MED ORDER — KETOROLAC TROMETHAMINE 30 MG/ML IJ SOLN
INTRAMUSCULAR | Status: DC | PRN
  Administered 2015-12-19: 30 mg via INTRAVENOUS

## 2015-12-19 MED ORDER — DEXAMETHASONE SODIUM PHOSPHATE 10 MG/ML IJ SOLN
INTRAMUSCULAR | Status: AC
  Filled 2015-12-19: qty 1

## 2015-12-19 MED ORDER — LACTATED RINGERS IV SOLN
INTRAVENOUS | Status: DC
  Administered 2015-12-19 (×2): via INTRAVENOUS
  Filled 2015-12-19: qty 1000

## 2015-12-19 MED ORDER — ONDANSETRON HCL 4 MG/2ML IJ SOLN
INTRAMUSCULAR | Status: AC
  Filled 2015-12-19: qty 2

## 2015-12-19 MED ORDER — ROCURONIUM BROMIDE 100 MG/10ML IV SOLN
INTRAVENOUS | Status: DC | PRN
  Administered 2015-12-19: 20 mg via INTRAVENOUS

## 2015-12-19 MED ORDER — NEOSTIGMINE METHYLSULFATE 10 MG/10ML IV SOLN
INTRAVENOUS | Status: AC
  Filled 2015-12-19: qty 1

## 2015-12-19 MED ORDER — BUPIVACAINE HCL 0.25 % IJ SOLN
INTRAMUSCULAR | Status: DC | PRN
  Administered 2015-12-19: 2 mL

## 2015-12-19 SURGICAL SUPPLY — 48 items
APPLICATOR COTTON TIP 6IN STRL (MISCELLANEOUS) ×3 IMPLANT
BANDAGE ADH SHEER 1  50/CT (GAUZE/BANDAGES/DRESSINGS) ×3 IMPLANT
BANDAGE ADHESIVE 1X3 (GAUZE/BANDAGES/DRESSINGS) IMPLANT
BLADE SURG 11 STRL SS (BLADE) ×3 IMPLANT
CANISTER SUCTION 1200CC (MISCELLANEOUS) IMPLANT
CANISTER SUCTION 2500CC (MISCELLANEOUS) IMPLANT
CATH ROBINSON RED A/P 16FR (CATHETERS) ×3 IMPLANT
COVER MAYO STAND STRL (DRAPES) ×3 IMPLANT
DRAPE UNDERBUTTOCKS STRL (DRAPE) ×3 IMPLANT
ELECT REM PT RETURN 9FT ADLT (ELECTROSURGICAL) ×3
ELECTRODE REM PT RTRN 9FT ADLT (ELECTROSURGICAL) ×1 IMPLANT
FILTER SMOKE EVAC LAPAROSHD (FILTER) IMPLANT
GLOVE SURG SS PI 7.0 STRL IVOR (GLOVE) ×6 IMPLANT
GOWN STRL REUS W/TWL XL LVL3 (GOWN DISPOSABLE) ×6 IMPLANT
KIT ROOM TURNOVER WOR (KITS) ×3 IMPLANT
LEGGING LITHOTOMY PAIR STRL (DRAPES) ×3 IMPLANT
LIQUID BAND (GAUZE/BANDAGES/DRESSINGS) ×3 IMPLANT
NEEDLE HYPO 25X1 1.5 SAFETY (NEEDLE) ×3 IMPLANT
NEEDLE INSUFFLATION 14GA 120MM (NEEDLE) IMPLANT
NS IRRIG 500ML POUR BTL (IV SOLUTION) ×3 IMPLANT
PACK BASIN DAY SURGERY FS (CUSTOM PROCEDURE TRAY) ×3 IMPLANT
PACK LAPAROSCOPY II (CUSTOM PROCEDURE TRAY) ×3 IMPLANT
PAD OB MATERNITY 4.3X12.25 (PERSONAL CARE ITEMS) ×3 IMPLANT
PAD PREP 24X48 CUFFED NSTRL (MISCELLANEOUS) ×3 IMPLANT
POUCH SPECIMEN RETRIEVAL 10MM (ENDOMECHANICALS) IMPLANT
SCISSORS LAP 5X35 DISP (ENDOMECHANICALS) IMPLANT
SCISSORS LAP 5X45 EPIX DISP (ENDOMECHANICALS) IMPLANT
SEALER TISSUE G2 CVD JAW 35 (ENDOMECHANICALS) IMPLANT
SEALER TISSUE G2 CVD JAW 45CM (ENDOMECHANICALS) IMPLANT
SET IRRIG TUBING LAPAROSCOPIC (IRRIGATION / IRRIGATOR) IMPLANT
SOLUTION ANTI FOG 6CC (MISCELLANEOUS) ×3 IMPLANT
SUT VIC AB 3-0 PS2 18 (SUTURE) ×2
SUT VIC AB 3-0 PS2 18XBRD (SUTURE) ×1 IMPLANT
SUT VICRYL 0 ENDOLOOP (SUTURE) IMPLANT
SUT VICRYL 0 UR6 27IN ABS (SUTURE) IMPLANT
SUT VICRYL 4-0 PS2 18IN ABS (SUTURE) IMPLANT
SYR CONTROL 10ML LL (SYRINGE) ×3 IMPLANT
SYRINGE 10CC LL (SYRINGE) IMPLANT
TOWEL OR 17X24 6PK STRL BLUE (TOWEL DISPOSABLE) ×6 IMPLANT
TRAY DSU PREP LF (CUSTOM PROCEDURE TRAY) ×3 IMPLANT
TROCAR BALLN 12MMX100 BLUNT (TROCAR) IMPLANT
TROCAR OPTI TIP 5M 100M (ENDOMECHANICALS) ×3 IMPLANT
TROCAR XCEL DIL TIP R 11M (ENDOMECHANICALS) IMPLANT
TUBE CONNECTING 12'X1/4 (SUCTIONS)
TUBE CONNECTING 12X1/4 (SUCTIONS) IMPLANT
TUBING INSUFFLATION 10FT LAP (TUBING) ×3 IMPLANT
WARMER LAPAROSCOPE (MISCELLANEOUS) ×3 IMPLANT
WATER STERILE IRR 500ML POUR (IV SOLUTION) ×3 IMPLANT

## 2015-12-19 NOTE — Anesthesia Postprocedure Evaluation (Signed)
Anesthesia Post Note  Patient: Samantha Hartman  Procedure(s) Performed: Procedure(s) (LRB): LAPAROSCOPIC BILATERAL TUBAL LIGATION (Bilateral)  Patient location during evaluation: PACU Anesthesia Type: General Level of consciousness: awake and alert Pain management: pain level controlled Vital Signs Assessment: post-procedure vital signs reviewed and stable Respiratory status: spontaneous breathing, nonlabored ventilation, respiratory function stable and patient connected to nasal cannula oxygen Cardiovascular status: blood pressure returned to baseline and stable Postop Assessment: no signs of nausea or vomiting Anesthetic complications: no Comments: Initially had episode of persistent muscle weakness clinically following extubation even though had 4 twitches on TOF monitoring.. suggamadex given with good clinical effect.. Looks great post operatively    Last Vitals:  Filed Vitals:   12/19/15 0830 12/19/15 0845  BP: 103/70 106/68  Pulse: 60 59  Temp:    Resp: 11 17    Last Pain:  Filed Vitals:   12/19/15 0853  PainSc: 6                  Reino KentJudd, Oralee Rapaport J

## 2015-12-19 NOTE — Discharge Instructions (Signed)
Laparoscopic Tubal Ligation, Care After °Refer to this sheet in the next few weeks. These instructions provide you with information about caring for yourself after your procedure. Your health care provider may also give you more specific instructions. Your treatment has been planned according to current medical practices, but problems sometimes occur. Call your health care provider if you have any problems or questions after your procedure. °WHAT TO EXPECT AFTER THE PROCEDURE °After your procedure, it is common to have: °· Sore throat. °· Soreness at the incision site. °· Mild cramping. °· Tiredness. °· Mild nausea or vomiting. °· Shoulder pain. °HOME CARE INSTRUCTIONS °· Rest for the remainder of the day. °· Take medicines only as directed by your health care provider. These include over-the-counter medicines and prescription medicines. Do not take aspirin, which can cause bleeding. °· Over the next few days, gradually return to your normal activities and your normal diet. °· Avoid sexual intercourse for 2 weeks or as directed by your health care provider. °· Do not use tampons, and do not douche. °· Do not drive or operate heavy machinery while taking pain medicine. °· Do not lift anything that is heavier than 5 lb (2.3 kg) for 2 weeks or as directed by your health care provider. °· Do not take baths. Take showers only. Ask your health care provider when you can start taking baths. °· Take your temperature twice each day and write it down. °· Try to have help for your household needs for the first 7-10 days. °· There are many different ways to close and cover an incision, including stitches (sutures), skin glue, and adhesive strips. Follow instructions from your health care provider about: °¨ Incision care. °¨ Bandage (dressing) changes and removal. °¨ Incision closure removal. °· Check your incision area every day for signs of infection. Watch for: °¨ Redness, swelling, or pain. °¨ Fluid, blood, or pus. °· Keep  all follow-up visits as directed by your health care provider. °SEEK MEDICAL CARE IF: °· You have redness, swelling, or increasing pain in your incision area. °· You have fluid, blood, or pus coming from your incision for longer than 1 day. °· You notice a bad smell coming from your incision or your dressing. °· The edges of your incision break open after the sutures have been removed. °· Your pain does not decrease after 2-3 days. °· You have a rash. °· You repeatedly become dizzy or light-headed. °· You have a reaction to your medicine. °· Your pain medicine is not helping. °· You are constipated. °SEEK IMMEDIATE MEDICAL CARE IF: °· You have a fever. °· You faint. °· You have increasing pain in your abdomen. °· You have severe pain in one or both of your shoulders. °· You have bleeding or drainage from your suture sites or your vagina after surgery. °· You have shortness of breath or have difficulty breathing. °· You have chest pain or leg pain. °· You have ongoing nausea, vomiting, or diarrhea. °  °This information is not intended to replace advice given to you by your health care provider. Make sure you discuss any questions you have with your health care provider. °  °Document Released: 04/17/2005 Document Revised: 02/12/2015 Document Reviewed: 01/09/2012 °Elsevier Interactive Patient Education ©2016 [employer] ° °

## 2015-12-19 NOTE — Transfer of Care (Signed)
Immediate Anesthesia Transfer of Care Note  Patient: Samantha Hartman  Procedure(s) Performed: Procedure(s) (LRB): LAPAROSCOPIC BILATERAL TUBAL LIGATION (Bilateral)  Patient Location: PACU  Anesthesia Type: General  Level of Consciousness: awake, oriented, sedated and patient cooperative  Airway & Oxygen Therapy: Patient Spontanous Breathing and Patient connected to face mask oxygen  Post-op Assessment: Pt c/o/shortness of breath - 100mg  bridion IV, pt with strong grip, TOF4/4. Report given to PACU RN and Post -op Vital signs reviewed and stable  Post vital signs: Reviewed and stable, HR - 56, BP - 113/88, Sat - 100%, Resp - 14  Complications: No apparent anesthesia complications

## 2015-12-19 NOTE — Anesthesia Preprocedure Evaluation (Signed)
Anesthesia Evaluation  Patient identified by MRN, date of birth, ID band Patient awake    Reviewed: Allergy & Precautions, H&P , NPO status , Patient's Chart, lab work & pertinent test results  History of Anesthesia Complications Negative for: history of anesthetic complications  Airway Mallampati: II  TM Distance: >3 FB Neck ROM: full    Dental no notable dental hx.    Pulmonary neg pulmonary ROS,    Pulmonary exam normal breath sounds clear to auscultation       Cardiovascular negative cardio ROS Normal cardiovascular exam Rhythm:regular Rate:Normal     Neuro/Psych negative neurological ROS     GI/Hepatic Neg liver ROS, GERD  ,  Endo/Other  negative endocrine ROS  Renal/GU negative Renal ROS     Musculoskeletal   Abdominal   Peds  Hematology negative hematology ROS (+)   Anesthesia Other Findings   Reproductive/Obstetrics negative OB ROS                             Anesthesia Physical Anesthesia Plan  ASA: II  Anesthesia Plan: General   Post-op Pain Management:    Induction: Intravenous  Airway Management Planned: Oral ETT  Additional Equipment:   Intra-op Plan:   Post-operative Plan: Extubation in OR  Informed Consent: I have reviewed the patients History and Physical, chart, labs and discussed the procedure including the risks, benefits and alternatives for the proposed anesthesia with the patient or authorized representative who has indicated his/her understanding and acceptance.   Dental Advisory Given  Plan Discussed with: Anesthesiologist, CRNA and Surgeon  Anesthesia Plan Comments:         Anesthesia Quick Evaluation  

## 2015-12-19 NOTE — Anesthesia Procedure Notes (Signed)
Procedure Name: Intubation Date/Time: 12/19/2015 7:21 AM Performed by: Renella CunasHAZEL, Semira Stoltzfus D Pre-anesthesia Checklist: Patient identified, Emergency Drugs available, Suction available and Patient being monitored Patient Re-evaluated:Patient Re-evaluated prior to inductionOxygen Delivery Method: Circle System Utilized Preoxygenation: Pre-oxygenation with 100% oxygen Intubation Type: IV induction Ventilation: Mask ventilation without difficulty Laryngoscope Size: Mac and 3 Grade View: Grade I Tube type: Oral Tube size: 7.0 mm Number of attempts: 1 Airway Equipment and Method: Stylet and Oral airway Placement Confirmation: ETT inserted through vocal cords under direct vision,  positive ETCO2 and breath sounds checked- equal and bilateral Secured at: 22 cm Tube secured with: Tape Dental Injury: Teeth and Oropharynx as per pre-operative assessment

## 2015-12-19 NOTE — Brief Op Note (Signed)
12/19/2015  7:44 AM  PATIENT:  Samantha Hartman  30 y.o. female  PRE-OPERATIVE DIAGNOSIS:  DESIRES STERILITY  POST-OPERATIVE DIAGNOSIS:  DESIRES STERILITY  PROCEDURE:  Procedure(s): LAPAROSCOPIC BILATERAL TUBAL LIGATION (Bilateral)  SURGEON:  Surgeon(s) and Role:    * Richardean ChimeraJohn Courtenay Hirth, MD - Primary  PHYSICIAN ASSISTANT:   ASSISTANTS: none   ANESTHESIA:   local and general  EBL:  Total I/O In: -  Out: 10 [Blood:10]  BLOOD ADMINISTERED:none  DRAINS: none   LOCAL MEDICATIONS USED:  MARCAINE     SPECIMEN:  No Specimen  DISPOSITION OF SPECIMEN:  N/A  COUNTS:  YES  TOURNIQUET:  * No tourniquets in log *  DICTATION: .Other Dictation: Dictation Number [id number]  PLAN OF CARE: Discharge to home after PACU  PATIENT DISPOSITION:  PACU - hemodynamically stable.   Delay start of Pharmacological VTE agent (>24hrs) due to surgical blood loss or risk of bleeding: not applicable

## 2015-12-19 NOTE — Op Note (Signed)
NAME:  Samantha Hartman, Samantha Hartman              ACCOUNT NO.:  [id number]  MEDICAL RECORD NO.:  [id number]  LOCATION:                                 FACILITY:  PHYSICIAN:  Juluis MireJohn S. Yasheka Fossett, M.D.        DATE OF BIRTH:  DATE OF PROCEDURE:  12/19/2015 DATE OF DISCHARGE:                              OPERATIVE REPORT   PREOPERATIVE DIAGNOSIS:  Multiparity desires sterility.  Intrauterine device in place.  POSTOPERATIVE DIAGNOSIS:  Multiparity desires sterility.  Intrauterine device in place.  OPERATIVE PROCEDURE:  Removal of intrauterine device.  Laparoscopic bilateral tubal fulguration.  SURGEON:  Juluis MireJohn S. Jameire Kouba, M.D.  ANESTHESIA:  General.  ESTIMATED BLOOD LOSS:  Minimal.  PACKS AND DRAINS:  None.  INTRAOPERATIVE BLOOD PLACEMENT:  None.  COMPLICATIONS:  None.  INDICATION:  Dictated in history and physical.  PROCEDURE IN DETAIL:  The patient was taken to the OR and placed in supine position.  After satisfactory level of general endotracheal anesthesia was obtained, the patient was placed in dorsal lithotomy position using Allen stirrups.  The abdomen, perineum, and vagina were prepped out with Betadine.  Bladder was emptied by in and out of catheterization.  A speculum was placed in the vaginal vault.  Previous IUD was removed intact.  The anterior lip of the cervix was secured with a single-tooth tenaculum.  The Hulka tenaculum then put in place and secured.  Single-tooth tenaculum was inspected and removed.  The patient then draped as a sterile field.  Subumbilical incision made with knife. A Veress needle was introduced into the abdominal cavity.  Abdomen inflated with approximately 3 L of carbon dioxide.  A 10/11 trocar was inserted.  Laparoscope was introduced.  No evidence of injury to adjacent organs.  Upper abdomen including the liver and tip of the gallbladder and both lateral gutters were clear.  The patient was placed in Trendelenburg position.  Uterus, tubes, and ovaries  were unremarkable.  Mid segment of each tube was cauterized for a distance of 3 cm.  The cautery was continued until resistance read 0.  The same segment was re-cauterized completely desiccating the tube.  In the procedure, both tubes have been adequately coagulated.  At this point in time, the laparoscope was removed, abdomen was deflated with carbon dioxide.  The trocars were removed.  Subumbilical incision closed with interrupted subcuticular sutures of 3-0 Vicryl.  The Hulka tenaculum removed.  The patient was taken out of dorsal lithotomy position.  Once alert, extubated, transferred to recovery room in good condition.  Sponge, instrument, and needle count was reported as correct by circulating nurse x2.     Juluis MireJohn S. Seth Friedlander, M.D.     JSM/MEDQ  D:  12/19/2015  T:  12/19/2015  Job:  [id number]

## 2015-12-20 ENCOUNTER — Encounter (HOSPITAL_BASED_OUTPATIENT_CLINIC_OR_DEPARTMENT_OTHER): Payer: Self-pay | Admitting: Obstetrics and Gynecology

## 2015-12-24 DIAGNOSIS — H5203 Hypermetropia, bilateral: Secondary | ICD-10-CM | POA: Diagnosis not present

## 2016-01-21 ENCOUNTER — Encounter: Payer: Self-pay | Admitting: Family Medicine

## 2016-01-21 ENCOUNTER — Ambulatory Visit: Payer: 59 | Admitting: Family Medicine

## 2016-01-21 VITALS — BP 120/80 | HR 78 | Temp 98.4°F | Wt 162.0 lb

## 2016-01-21 DIAGNOSIS — J45901 Unspecified asthma with (acute) exacerbation: Secondary | ICD-10-CM | POA: Insufficient documentation

## 2016-01-21 DIAGNOSIS — J4521 Mild intermittent asthma with (acute) exacerbation: Secondary | ICD-10-CM

## 2016-01-21 MED ORDER — ALBUTEROL SULFATE HFA 108 (90 BASE) MCG/ACT IN AERS: 2.0000 | INHALATION_SPRAY | Freq: Four times a day (QID) | RESPIRATORY_TRACT | Status: DC | PRN

## 2016-01-21 MED ORDER — PREDNISONE 20 MG PO TABS: ORAL_TABLET | ORAL | Status: DC

## 2016-01-21 NOTE — Patient Instructions (Addendum)
Prednisone 20 mg.......... 2 tabs now....... then 2 tabs daily until your symptoms markedly diminished........ then taper as outlined.......Marland Kitchen. 1 tab for 3 days....Marland Kitchen. A half a tab for 3 days....... than a half a tab Monday Wednesday Friday for a two-week taper  Albuterol........... 2 puffs every 6 hours when necessary  Return on Monday for follow-up

## 2016-01-21 NOTE — Progress Notes (Signed)
   Subjective:    Patient ID: Samantha Hartman, female    DOB: Oct 07, 1986, 30 y.o.   MRN: [id number]  HPI Samantha Hartman is a 30 year old married female nonsmoker who comes in today for evaluation of asthma  [place] a new to [place] she developed some spring allergies with head congestion runny nose for the eyes etc. and then last night she started wheezing. Said she had wheezed in many years. Both her children have asthma. She is to shots of albuterol which helped and a Benadryl she slept all night.   Review of Systems  Review of systems otherwise negative    Objective:   Physical Exam Well-developed well-nourished female no acute distress vital signs stable she's afebrile respiratory rate 12 and unlabored  HEENT were negative neck was supple no adenopathy lungs are clear except for some mild symmetrical wheezing on forced expiration       Assessment & Plan:  Mild asthma,,,,,,,,,,,,,,,,,,,, prednisone burst and taper,,,, return on Monday for follow-up,

## 2016-01-21 NOTE — Progress Notes (Signed)
Pre visit review using our clinic review tool, if applicable. No additional management support is needed unless otherwise documented below in the visit note. 

## 2016-01-27 ENCOUNTER — Ambulatory Visit (INDEPENDENT_AMBULATORY_CARE_PROVIDER_SITE_OTHER): Payer: 59 | Admitting: Family Medicine

## 2016-01-27 ENCOUNTER — Encounter: Payer: Self-pay | Admitting: Family Medicine

## 2016-01-27 VITALS — BP 120/80 | HR 91 | Temp 98.6°F | Wt 163.5 lb

## 2016-01-27 DIAGNOSIS — J4521 Mild intermittent asthma with (acute) exacerbation: Secondary | ICD-10-CM

## 2016-01-27 MED ORDER — MONTELUKAST SODIUM 10 MG PO TABS: 10.0000 mg | ORAL_TABLET | Freq: Every day | ORAL | Status: DC

## 2016-01-27 NOTE — Patient Instructions (Signed)
Prednisone 20 mg............. one tablet now............... starting tomorrow morning take 2 tabs for 3 days......... then Friday drop down to 1 a day  Singulair 10 mg.......Marland Kitchen. 1 at bedtime  Albuterol..........Marland Kitchen. 1-2 puffs every 6-8 hours when necessary  Follow-up in one week

## 2016-01-27 NOTE — Progress Notes (Signed)
   Subjective:    Patient ID: Samantha Hartman, female    DOB: 07-09-1986, 30 y.o.   MRN: [id number]  HPI Comment is a 30 year old married female nonsmoker who comes in today for follow-up of allergic rhinitis and asthma  We saw her last week with a flare up of allergic rhinitis which then turned and asthma. We started on albuterol 2 puffs twice a day along with prednisone 40 mg daily. After 3 days prednisone she felt much better than on Saturday TO 20 mg a day and her symptoms flare. Also she noticed her eyes are swollen and itchy still.  No side effects from prednisone   Review of Systems review of systems otherwise    Objective:   Physical Exam  well-developed well-nourished female no acute distress vital signs stable she's afebrile pulmonary exam shows symmetrical breath sounds mild late expiratory wheezing on forced expiration        Assessment & Plan:  Asthma unresolved,,,,,,,,,,,,,,,,,, add Singulair,,,,,,,, increased prednisone to 40 mg a day for 3 days then begin to taper again follow-up in one week,

## 2016-02-03 ENCOUNTER — Ambulatory Visit (INDEPENDENT_AMBULATORY_CARE_PROVIDER_SITE_OTHER): Payer: 59 | Admitting: Family Medicine

## 2016-02-03 ENCOUNTER — Encounter: Payer: Self-pay | Admitting: Family Medicine

## 2016-02-03 VITALS — BP 120/80 | Temp 98.1°F

## 2016-02-03 DIAGNOSIS — N6320 Unspecified lump in the left breast, unspecified quadrant: Secondary | ICD-10-CM | POA: Insufficient documentation

## 2016-02-03 DIAGNOSIS — J4521 Mild intermittent asthma with (acute) exacerbation: Secondary | ICD-10-CM

## 2016-02-03 DIAGNOSIS — N63 Unspecified lump in breast: Secondary | ICD-10-CM

## 2016-02-03 MED ORDER — FLUTICASONE PROPIONATE 50 MCG/ACT NA SUSP: 2.0000 | Freq: Every day | NASAL | Status: DC

## 2016-02-03 NOTE — Progress Notes (Signed)
Pre visit review using our clinic review tool, if applicable. No additional management support is needed unless otherwise documented below in the visit note. 

## 2016-02-03 NOTE — Progress Notes (Signed)
   Subjective:    Patient ID: Griselda MinerCarmen Hossain, female    DOB: November 02, 1985, 30 y.o.   MRN: [id number]  HPI [name] is a 30 year old married female nonsmoker who comes in today for follow-up of asthma and a new problem of a left breast mass  We've tapered her prednisone she's down to half a tablet a day for the last 4 days. She feels normal the sneezing and coughing and wheezing is completely stopped. She continues to Zyrtec and steroid nasal spray.  She's had soreness in her left breast in the past. This past winter it occurred lasted for about a month and went away. We felt a cystic type lesion 2:00 left breast about an inch and a half from the nipple at that time.  Her period is due today. 3 days ago she began having soreness in that left breast. Her mother has a history of breast cyst no cancer   Review of Systems    review of systems negative............ she's had a BTL Objective:   Physical Exam Well-developed well-nourished female no acute distress vital signs stable she's afebrile  Lungs are clear  Breast exam shows a cystic lesion left breast 2:00 an inch and a half nipple same area that we noticed the one before. It's about the size of a pea........ soft rubbery movable and tender.       Assessment & Plan:  Asthma resolving........ taper prednisone  Allergic rhinitis continue Zyrtec and steroid nasal spray........ add Singulair if needed  Cystic lesion left breast............ do BSE monthly........Marland Kitchen. return if any increases or size or if you have any concerns

## 2016-02-03 NOTE — Patient Instructions (Addendum)
Taper the prednisone as follows........... half a tab Monday Wednesday Friday for a two-week taper  Continue the Zyrtec plain........ one tablet daily  Continue the steroid nasal spray.......Marland Kitchen. 1 shot up each nostril at bedtime  If the Zyrtec and Singulair did not control your symptoms,,,,,,,, we will start Singulair 10 mg daily  Do a thorough breast exam about a week after your........ the cystic lesion is about an inch and half from the nipple at the 2:00 position left breast. If you feel like it's getting bigger or you have any concerns let us know

## 2016-02-21 ENCOUNTER — Other Ambulatory Visit: Payer: Self-pay | Admitting: *Deleted

## 2016-02-21 MED ORDER — FLUCONAZOLE 150 MG PO TABS: ORAL_TABLET | ORAL | Status: DC

## 2016-03-20 ENCOUNTER — Other Ambulatory Visit: Payer: Self-pay | Admitting: Family

## 2016-03-20 DIAGNOSIS — N6325 Unspecified lump in the left breast, overlapping quadrants: Secondary | ICD-10-CM

## 2016-03-20 DIAGNOSIS — N6320 Unspecified lump in the left breast, unspecified quadrant: Principal | ICD-10-CM

## 2016-03-23 ENCOUNTER — Ambulatory Visit
Admission: RE | Admit: 2016-03-23 | Discharge: 2016-03-23 | Disposition: A | Discharge status: Home / Self Care | Payer: BC Managed Care – PPO | Source: Ambulatory Visit | Attending: Family | Admitting: Family

## 2016-03-23 DIAGNOSIS — N6325 Unspecified lump in the left breast, overlapping quadrants: Secondary | ICD-10-CM

## 2016-03-23 DIAGNOSIS — N6320 Unspecified lump in the left breast, unspecified quadrant: Principal | ICD-10-CM

## 2016-05-08 ENCOUNTER — Encounter: Payer: Self-pay | Admitting: Adult Health

## 2016-05-08 ENCOUNTER — Ambulatory Visit (INDEPENDENT_AMBULATORY_CARE_PROVIDER_SITE_OTHER): Payer: BC Managed Care – PPO | Admitting: Adult Health

## 2016-05-08 VITALS — BP 106/72 | Ht 64.0 in | Wt 159.8 lb

## 2016-05-08 DIAGNOSIS — G43019 Migraine without aura, intractable, without status migrainosus: Secondary | ICD-10-CM | POA: Diagnosis not present

## 2016-05-08 DIAGNOSIS — L700 Acne vulgaris: Secondary | ICD-10-CM | POA: Diagnosis not present

## 2016-05-08 MED ORDER — KETOROLAC TROMETHAMINE 60 MG/2ML IM SOLN
60.0000 mg | Freq: Once | INTRAMUSCULAR | Status: AC
  Administered 2016-05-08: 60 mg via INTRAMUSCULAR

## 2016-05-08 MED ORDER — DOXYCYCLINE HYCLATE 100 MG PO CAPS: 100.0000 mg | ORAL_CAPSULE | Freq: Every day | ORAL | 3 refills | Status: DC

## 2016-05-08 MED ORDER — PROMETHAZINE HCL 25 MG PO TABS: 25.0000 mg | ORAL_TABLET | Freq: Four times a day (QID) | ORAL | 0 refills | Status: DC | PRN

## 2016-05-08 MED ORDER — SUMATRIPTAN SUCCINATE 50 MG PO TABS: 50.0000 mg | ORAL_TABLET | ORAL | 4 refills | Status: DC | PRN

## 2016-05-08 NOTE — Progress Notes (Signed)
   Subjective:    Patient ID: Samantha Hartman, female    DOB: Jan 27, 1986, 30 y.o.   MRN: [id number]  HPI  30 year old female who presents to the office today with the complaint of a migraine for the last three days. She has a history of migraines and she feels as though this is a typical migraine for her just worse. The migraine is located on the left temporal region. She denies any aura. Has been nauseated and has photophobia.   She has tried excedrin but denies any relief.   She would also like to go back on a course of doxycycline for her acne.   Review of Systems  Constitutional: Positive for activity change and appetite change. Negative for fever.  Eyes: Positive for photophobia. Negative for visual disturbance.  Respiratory: Negative.   Cardiovascular: Negative.   Gastrointestinal: Positive for nausea. Negative for abdominal pain, diarrhea and vomiting.  Musculoskeletal: Negative.   Neurological: Positive for headaches. Negative for dizziness.  Hematological: Negative.   Psychiatric/Behavioral: Negative.   All other systems reviewed and are negative.      Objective:   Physical Exam  Constitutional: She is oriented to person, place, and time. She appears well-developed and well-nourished. No distress.  HENT:  Head: Normocephalic and atraumatic.  Right Ear: External ear normal.  Left Ear: External ear normal.  Nose: Nose normal.  Mouth/Throat: Oropharynx is clear and moist. No oropharyngeal exudate.  Eyes: Conjunctivae and EOM are normal. Pupils are equal, round, and reactive to light. Right eye exhibits no discharge.  Neck: Normal range of motion. Neck supple.  Cardiovascular: Normal rate, regular rhythm, normal heart sounds and intact distal pulses.  Exam reveals no gallop and no friction rub.   No murmur heard. Pulmonary/Chest: Effort normal and breath sounds normal. No respiratory distress. She has no wheezes. She has no rales. She exhibits no tenderness.    Lymphadenopathy:    She has no cervical adenopathy.  Neurological: She is alert and oriented to person, place, and time.  Skin: Skin is warm and dry. No rash noted. She is not diaphoretic. No erythema. No pallor.  Cystic acne scattered throughout face  Psychiatric: She has a normal mood and affect. Her behavior is normal. Judgment and thought content normal.  Nursing note and vitals reviewed.      Assessment & Plan:  1. 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 May repeat in 2 hours if headache persists or recurs.  Dispense: 10 tablet; Refill: 4 - promethazine (PHENERGAN) 25 MG tablet; Take 1 tablet (25 mg total) by mouth every 6 (six) hours as needed for nausea or vomiting.  Dispense: 30 tablet; Refill: 0 - ketorolac (TORADOL) injection 60 mg; Inject 2 mLs (60 mg total) into the muscle once. - Follow up if no improvement   2. Acne vulgaris - doxycycline (VIBRAMYCIN) 100 MG capsule; Take 1 capsule (100 mg total) by mouth daily.  Dispense: 30 capsule; Refill: 3

## 2016-05-08 NOTE — Patient Instructions (Addendum)
It was great seeing you today .   I have sent in a prescription for Imitrex. Take as directed  I have also sent in a prescription for Phenergyn for nausea and Doxycycline for your skin.   Please let me know if you are not feeling any better     Migraine Headache A migraine headache is an intense, throbbing pain on one or both sides of your head. A migraine can last for 30 minutes to several hours. CAUSES  The exact cause of a migraine headache is not always known. However, a migraine may be caused when nerves in the brain become irritated and release chemicals that cause inflammation. This causes pain. Certain things may also trigger migraines, such as:  Alcohol.  Smoking.  Stress.  Menstruation.  Aged cheeses.  Foods or drinks that contain nitrates, glutamate, aspartame, or tyramine.  Lack of sleep.  Chocolate.  Caffeine.  Hunger.  Physical exertion.  Fatigue.  Medicines used to treat chest pain (nitroglycerine), birth control pills, estrogen, and some blood pressure medicines. SIGNS AND SYMPTOMS  Pain on one or both sides of your head.  Pulsating or throbbing pain.  Severe pain that prevents daily activities.  Pain that is aggravated by any physical activity.  Nausea, vomiting, or both.  Dizziness.  Pain with exposure to bright lights, loud noises, or activity.  General sensitivity to bright lights, loud noises, or smells. Before you get a migraine, you may get warning signs that a migraine is coming (aura). An aura may include:  Seeing flashing lights.  Seeing bright spots, halos, or zigzag lines.  Having tunnel vision or blurred vision.  Having feelings of numbness or tingling.  Having trouble talking.  Having muscle weakness. DIAGNOSIS  A migraine headache is often diagnosed based on:  Symptoms.  Physical exam.  A CT scan or MRI of your head. These imaging tests cannot diagnose migraines, but they can help rule out other causes of  headaches. TREATMENT Medicines may be given for pain and nausea. Medicines can also be given to help prevent recurrent migraines.  HOME CARE INSTRUCTIONS  Only take over-the-counter or prescription medicines for pain or discomfort as directed by your health care provider. The use of long-term narcotics is not recommended.  Lie down in a dark, quiet room when you have a migraine.  Keep a journal to find out what may trigger your migraine headaches. For example, write down:  What you eat and drink.  How much sleep you get.  Any change to your diet or medicines.  Limit alcohol consumption.  Quit smoking if you smoke.  Get 7-9 hours of sleep, or as recommended by your health care provider.  Limit stress.  Keep lights dim if bright lights bother you and make your migraines worse. SEEK IMMEDIATE MEDICAL CARE IF:   Your migraine becomes severe.  You have a fever.  You have a stiff neck.  You have vision loss.  You have muscular weakness or loss of muscle control.  You start losing your balance or have trouble walking.  You feel faint or pass out.  You have severe symptoms that are different from your first symptoms. MAKE SURE YOU:   Understand these instructions.  Will watch your condition.  Will get help right away if you are not doing well or get worse.   This information is not intended to replace advice given to you by your health care provider. Make sure you discuss any questions you have with your health care  provider.   Document Released: 09/28/2005 Document Revised: 10/19/2014 Document Reviewed: 06/05/2013 Elsevier Interactive Patient Education [employer].

## 2016-05-18 ENCOUNTER — Other Ambulatory Visit: Payer: Self-pay | Admitting: Adult Health

## 2016-05-18 ENCOUNTER — Telehealth: Payer: Self-pay | Admitting: Adult Health

## 2016-05-18 MED ORDER — ERYTHROMYCIN 5 MG/GM OP OINT: 1.0000 "application " | TOPICAL_OINTMENT | Freq: Four times a day (QID) | OPHTHALMIC | 1 refills | Status: AC

## 2016-05-18 NOTE — Telephone Encounter (Signed)
Patient calls complaining of itchy " like sand paper", red eyes that have been matted shut when she wakes up in the morning. She reports that she has discharge from her eyes throughout the day that " looks like greenish pus".   Denies any sick contacts or exposure to conjuntivitis.

## 2016-05-28 ENCOUNTER — Telehealth: Payer: Self-pay

## 2016-05-28 ENCOUNTER — Other Ambulatory Visit: Payer: Self-pay

## 2016-05-28 MED ORDER — FLUCONAZOLE 150 MG PO TABS: 150.0000 mg | ORAL_TABLET | Freq: Once | ORAL | 1 refills | Status: AC

## 2016-05-28 MED ORDER — FLUCONAZOLE 150 MG PO TABS: 150.0000 mg | ORAL_TABLET | Freq: Once | ORAL | 1 refills | Status: DC

## 2016-05-28 NOTE — Telephone Encounter (Signed)
Patient would like diflucan sent to [facility]. Please advise.

## 2016-05-28 NOTE — Telephone Encounter (Signed)
Rx sent 

## 2016-05-28 NOTE — Telephone Encounter (Signed)
Ok to send one dose of Diflucan with one additional refill

## 2016-06-08 ENCOUNTER — Encounter: Payer: Self-pay | Admitting: Adult Health

## 2016-06-09 ENCOUNTER — Other Ambulatory Visit: Payer: Self-pay | Admitting: Adult Health

## 2016-06-09 DIAGNOSIS — G43819 Other migraine, intractable, without status migrainosus: Secondary | ICD-10-CM

## 2016-07-16 ENCOUNTER — Ambulatory Visit (INDEPENDENT_AMBULATORY_CARE_PROVIDER_SITE_OTHER): Payer: BC Managed Care – PPO | Admitting: Neurology

## 2016-07-16 ENCOUNTER — Encounter: Payer: Self-pay | Admitting: Neurology

## 2016-07-16 VITALS — BP 120/76 | HR 92 | Ht 64.0 in | Wt 157.0 lb

## 2016-07-16 DIAGNOSIS — G43009 Migraine without aura, not intractable, without status migrainosus: Secondary | ICD-10-CM

## 2016-07-16 MED ORDER — TOPIRAMATE ER 50 MG PO CAP24: 1.0000 | ORAL_CAPSULE | Freq: Every day | ORAL | 2 refills | Status: DC

## 2016-07-16 MED ORDER — NAPROXEN 500 MG PO TABS: 500.0000 mg | ORAL_TABLET | Freq: Two times a day (BID) | ORAL | 2 refills | Status: DC | PRN

## 2016-07-16 MED ORDER — SUMATRIPTAN SUCCINATE 100 MG PO TABS: ORAL_TABLET | ORAL | 2 refills | Status: DC

## 2016-07-16 NOTE — Patient Instructions (Signed)
Migraine Recommendations: 1.  Start Trokendi XR 50mg  at bedtime..  Call in 4 weeks with update and we can adjust dose if needed.  Possible side effects include: impaired thinking, sedation, paresthesias (numbness and tingling) and weight loss.  It may cause dehydration and there is a small risk for kidney stones, so make sure to stay hydrated with water during the day.  There is also a very small risk for glaucoma, so if you notice any change in your vision while taking this medication, see an ophthalmologist.  There is also a very small risk of possible suicidal ideation, as it the case with all antiepileptic medications.  2.  Take sumatriptan 100mg  with or without naproxen 500mg  at earliest onset of headache.  May repeat dose with or without naproxen once in 2 hours if needed.  Do not exceed two doses in 24 hours. 3.  Limit use of pain relievers to no more than 2 days out of the week.  These medications include acetaminophen, ibuprofen, triptans and narcotics.  This will help reduce risk of rebound headaches. 4.  Be aware of common food triggers such as processed sweets, processed foods with nitrites (such as deli meat, hot dogs, sausages), foods with MSG, alcohol (such as wine), chocolate, certain cheeses, certain fruits (dried fruits, some citrus fruit), vinegar, diet soda. 4.  Avoid caffeine 5.  Routine exercise 6.  Proper sleep hygiene 7.  Stay adequately hydrated with water 8.  Keep a headache diary. 9.  Maintain proper stress management. 10.  Do not skip meals. 11.  Consider supplements:  Magnesium oxide 400mg  to 600mg  daily, riboflavin 400mg , Coenzyme Q 10 100mg  three times daily 12.  Follow up in 3 months but CONTACT US IN 4 WEEKS WITH UPDATE.   Migraine Headache A migraine headache is an intense, throbbing pain on one or both sides of your head. A migraine can last for 30 minutes to several hours. CAUSES  The exact cause of a migraine headache is not always known. However, a migraine  may be caused when nerves in the brain become irritated and release chemicals that cause inflammation. This causes pain. Certain things may also trigger migraines, such as:  Alcohol.  Smoking.  Stress.  Menstruation.  Aged cheeses.  Foods or drinks that contain nitrates, glutamate, aspartame, or tyramine.  Lack of sleep.  Chocolate.  Caffeine.  Hunger.  Physical exertion.  Fatigue.  Medicines used to treat chest pain (nitroglycerine), birth control pills, estrogen, and some blood pressure medicines. SIGNS AND SYMPTOMS  Pain on one or both sides of your head.  Pulsating or throbbing pain.  Severe pain that prevents daily activities.  Pain that is aggravated by any physical activity.  Nausea, vomiting, or both.  Dizziness.  Pain with exposure to bright lights, loud noises, or activity.  General sensitivity to bright lights, loud noises, or smells. Before you get a migraine, you may get warning signs that a migraine is coming (aura). An aura may include:  Seeing flashing lights.  Seeing bright spots, halos, or zigzag lines.  Having tunnel vision or blurred vision.  Having feelings of numbness or tingling.  Having trouble talking.  Having muscle weakness. DIAGNOSIS  A migraine headache is often diagnosed based on:  Symptoms.  Physical exam.  A CT scan or MRI of your head. These imaging tests cannot diagnose migraines, but they can help rule out other causes of headaches. TREATMENT Medicines may be given for pain and nausea. Medicines can also be given to help  prevent recurrent migraines.  HOME CARE INSTRUCTIONS  Only take over-the-counter or prescription medicines for pain or discomfort as directed by your health care provider. The use of long-term narcotics is not recommended.  Lie down in a dark, quiet room when you have a migraine.  Keep a journal to find out what may trigger your migraine headaches. For example, write down:  What you eat and  drink.  How much sleep you get.  Any change to your diet or medicines.  Limit alcohol consumption.  Quit smoking if you smoke.  Get 7-9 hours of sleep, or as recommended by your health care provider.  Limit stress.  Keep lights dim if bright lights bother you and make your migraines worse. SEEK IMMEDIATE MEDICAL CARE IF:   Your migraine becomes severe.  You have a fever.  You have a stiff neck.  You have vision loss.  You have muscular weakness or loss of muscle control.  You start losing your balance or have trouble walking.  You feel faint or pass out.  You have severe symptoms that are different from your first symptoms. MAKE SURE YOU:   Understand these instructions.  Will watch your condition.  Will get help right away if you are not doing well or get worse.   This information is not intended to replace advice given to you by your health care provider. Make sure you discuss any questions you have with your health care provider.   Document Released: 09/28/2005 Document Revised: 10/19/2014 Document Reviewed: 06/05/2013 Elsevier Interactive Patient Education [employer]

## 2016-07-16 NOTE — Progress Notes (Signed)
NEUROLOGY CONSULTATION NOTE  Samantha MinerCarmen Darrough MRN: [id number] DOB: 02-09-1986  Referring provider: Shirline Freesory Nafziger, NP Primary care provider: Shirline Freesory Nafziger, NP  Reason for consult:  migraine  HISTORY OF PRESENT ILLNESS: Samantha MinerCarmen Mcwethy is a 30 year old right-handed female with asthma and status post tubal ligation who presents for migraines.  History obtained by patient and PCP note.  Onset:  30 years old Location:  Varies:  Unilateral (either side), back of head/neck, retro-orbital Quality:  Squeezing, throbbing, burning in head Intensity:  10/10 Aura:  no Prodrome:  no Associated symptoms:  Nausea, vomiting, photophobia, phonophobia, blurred vision.  One time she had tingling in the left arm. Duration:  4-5 hours  Frequency:  2 to 3 days per week Triggers/exacerbating factors:  perfumes Relieving factors:  nothing Activity:  Cannot function about once a month  Past NSAIDS:  no Past analgesics:  Excedrin Past abortive triptans:  no Past muscle relaxants:  no Past anti-emetic:  no Past antihypertensive medications:  no Past antidepressant medications:  no Past anticonvulsant medications:  no Past vitamins/Herbal/Supplements:  no Other past therapy:  no  Current NSAIDS:  ibuprofen Current analgesics:  no Current triptans:  sumatriptan 50mg  Current anti-emetic:  promethazine 25mg  Current muscle relaxants:  no Current anti-anxiolytic:  no Current sleep aide:  no Current Antihypertensive medications:  no Current Antidepressant medications:  no Current Anticonvulsant medications:  no Current Vitamins/Herbal/Supplements:  no Current Antihistamines/Decongestants:  Xyzal, Flonase Other therapy:  no  Caffeine:  Coffee sometimes Alcohol:  no Smoker:  no Diet:  Hydrates, no processed food Exercise:  no Depression/stress:  no Sleep hygiene:  good Family history of headache:  No  Renal function from 11/15/15:  BUN 7, Cr 0.71, GFR 103.78  PAST MEDICAL HISTORY: Past  Medical History:  Diagnosis Date  . Asthma   . GERD (gastroesophageal reflux disease)   . Wears glasses     PAST SURGICAL HISTORY: Past Surgical History:  Procedure Laterality Date  . APPENDECTOMY  April 2016  . LAPAROSCOPIC TUBAL LIGATION Bilateral 12/19/2015   Procedure: LAPAROSCOPIC BILATERAL TUBAL LIGATION;  Surgeon: Richardean ChimeraJohn McComb, MD;  [facility] [place];  Service: Gynecology;  Laterality: Bilateral;    MEDICATIONS: Current Outpatient Prescriptions on File Prior to Visit  Medication Sig Dispense Refill  . albuterol (PROVENTIL HFA;VENTOLIN HFA) 108 (90 Base) MCG/ACT inhaler Inhale 2 puffs into the lungs every 6 (six) hours as needed for wheezing or shortness of breath. 1 Inhaler 5  . ALBUTEROL IN Inhale into the lungs as needed. Reported on 02/03/2016    . calcium carbonate (TUMS - DOSED IN MG ELEMENTAL CALCIUM) 500 MG chewable tablet Chew 1 tablet by mouth as needed for indigestion or heartburn. Reported on 02/03/2016    . doxycycline (VIBRAMYCIN) 100 MG capsule Take 1 capsule (100 mg total) by mouth daily. 30 capsule 3  . fluticasone (FLONASE) 50 MCG/ACT nasal spray Place 2 sprays into both nostrils daily. 32 g 6  . montelukast (SINGULAIR) 10 MG tablet Take 1 tablet (10 mg total) by mouth at bedtime. 100 tablet 3  . promethazine (PHENERGAN) 25 MG tablet Take 1 tablet (25 mg total) by mouth every 6 (six) hours as needed for nausea or vomiting. 30 tablet 0   No current facility-administered medications on file prior to visit.     ALLERGIES: Allergies  Allergen Reactions  . Other Itching and Swelling    APPLES, PINEAPPLE, KIWI  Cause itchy throat and mouth swells    FAMILY HISTORY: Family History  Problem Relation Age of Onset  . Stroke Mother   . Hypertension Mother   . Cancer Father     prostate    SOCIAL HISTORY: Social History   Social History  . Marital status: Married    Spouse name: N/A  . Number of children: N/A  . Years of education: N/A    Occupational History  . Not on file.   Social History Main Topics  . Smoking status: Never Smoker  . Smokeless tobacco: Never Used  . Alcohol use No  . Drug use: No  . Sexual activity: Yes    Birth control/ protection: IUD     Comment: 2014- placement Mirena IUD   Other Topics Concern  . Not on file   Social History Narrative  . No narrative on file    REVIEW OF SYSTEMS: Constitutional: No fevers, chills, or sweats, no generalized fatigue, change in appetite Eyes: No visual changes, double vision, eye pain Ear, nose and throat: No hearing loss, ear pain, nasal congestion, sore throat Cardiovascular: No chest pain, palpitations Respiratory:  No shortness of breath at rest or with exertion, wheezes GastrointestinaI: No nausea, vomiting, diarrhea, abdominal pain, fecal incontinence Genitourinary:  No dysuria, urinary retention or frequency Musculoskeletal:  No neck pain, back pain Integumentary: No rash, pruritus, skin lesions Neurological: as above Psychiatric: No depression, insomnia, anxiety Endocrine: No palpitations, fatigue, diaphoresis, mood swings, change in appetite, change in weight, increased thirst Hematologic/Lymphatic:  No purpura, petechiae. Allergic/Immunologic: no itchy/runny eyes, nasal congestion, recent allergic reactions, rashes  PHYSICAL EXAM: Vitals:   07/16/16 0957  BP: 120/76  Pulse: 92   General: No acute distress.  Patient appears well-groomed.  Head:  Normocephalic/atraumatic Eyes:  fundi examined but not visualized Neck: supple, no paraspinal tenderness, full range of motion Back: No paraspinal tenderness Heart: regular rate and rhythm Lungs: Clear to auscultation bilaterally. Vascular: No carotid bruits. Neurological Exam: Mental status: alert and oriented to person, place, and time, recent and remote memory intact, fund of knowledge intact, attention and concentration intact, speech fluent and not dysarthric, language intact. Cranial  nerves: CN I: not tested CN II: pupils equal, round and reactive to light, visual fields intact CN III, IV, VI:  full range of motion, no nystagmus, no ptosis CN V: facial sensation intact CN VII: upper and lower face symmetric CN VIII: hearing intact CN IX, X: gag intact, uvula midline CN XI: sternocleidomastoid and trapezius muscles intact CN XII: tongue midline Bulk & Tone: normal, no fasciculations. Motor:  5/5 throughout  Sensation: temperature and vibration sensation intact. Deep Tendon Reflexes:  2+ throughout, toes downgoing.  Finger to nose testing:  Without dysmetria.  Heel to shin:  Without dysmetria.  Gait:  Normal station and stride.  Able to turn and tandem walk. Romberg negative.  IMPRESSION: Migraine without aura  PLAN: 1.  Start Trokendi XR 50mg  at bedtime.  Side effects discussed.  Contact us in 4 weeks with update. 2. For abortive therapy, will increase sumatriptan to 100mg  and to take with naproxen 500mg .  Continue promethazine 25mg  for nausea.  Stop ibuprofen. 3.  Lifestyle modification:  Exercise, consider supplements (Mg, riboflavin, CoQ10) 4.  Follow up in 3 months.  Thank you for allowing me to take part in the care of this patient.  Shon Millet, DO  CC:  Shirline Frees, NP

## 2016-07-18 ENCOUNTER — Encounter: Payer: Self-pay | Admitting: Neurology

## 2016-07-19 ENCOUNTER — Encounter: Payer: Self-pay | Admitting: Neurology

## 2016-07-20 ENCOUNTER — Telehealth: Payer: Self-pay

## 2016-07-20 ENCOUNTER — Ambulatory Visit (INDEPENDENT_AMBULATORY_CARE_PROVIDER_SITE_OTHER): Payer: BC Managed Care – PPO

## 2016-07-20 DIAGNOSIS — G43009 Migraine without aura, not intractable, without status migrainosus: Secondary | ICD-10-CM

## 2016-07-20 MED ORDER — DIPHENHYDRAMINE HCL 50 MG/ML IJ SOLN
25.0000 mg | Freq: Once | INTRAMUSCULAR | Status: AC
  Administered 2016-07-20: 25 mg via INTRAMUSCULAR

## 2016-07-20 MED ORDER — METOCLOPRAMIDE HCL 5 MG/ML IJ SOLN
10.0000 mg | Freq: Once | INTRAMUSCULAR | Status: AC
  Administered 2016-07-20: 10 mg via INTRAMUSCULAR

## 2016-07-20 MED ORDER — SUMATRIPTAN 20 MG/ACT NA SOLN: NASAL | 2 refills | Status: DC

## 2016-07-20 MED ORDER — SODIUM CHLORIDE 0.9 % IV SOLN: 25.0000 mg | INTRAVENOUS | Status: DC | PRN

## 2016-07-20 MED ORDER — KETOROLAC TROMETHAMINE 60 MG/2ML IM SOLN
60.0000 mg | Freq: Once | INTRAMUSCULAR | Status: AC
  Administered 2016-07-20: 60 mg via INTRAMUSCULAR

## 2016-07-20 NOTE — Telephone Encounter (Signed)
First mychart message from pt, "Good afternoon Dr Everlena CooperJaffe today is been a really bad day. I waked up with numbness in my fingers and a bad headache. I been throwing up and I took my 2 douses of imitrex and naproxen."  Please advise.

## 2016-07-20 NOTE — Addendum Note (Signed)
Addended by: Sheilah MinsFOX, JADA A on: 07/20/2016 11:47 AM   Modules accepted: Orders

## 2016-07-20 NOTE — Telephone Encounter (Signed)
After pt came in for headache cocktail she sent another [employer] message.   "Hi Samantha Hartman thank you so much. Can you please ask Dr Everlena CooperJaffe if he considered an MRI or a Cast Can?"  Did advise pt that it can be difficult to get approval for patients with migraines, and therefor may not be able to be preformed even with provider ordering exam. Please advise.

## 2016-07-26 ENCOUNTER — Encounter: Payer: Self-pay | Admitting: Neurology

## 2016-07-27 NOTE — Telephone Encounter (Signed)
Please see message from patient

## 2016-07-31 ENCOUNTER — Encounter: Payer: Self-pay | Admitting: Neurology

## 2016-07-31 NOTE — Telephone Encounter (Signed)
Please see message from patient

## 2016-08-03 ENCOUNTER — Telehealth: Payer: Self-pay

## 2016-08-03 MED ORDER — TOPIRAMATE ER 50 MG PO CAP24: 100.0000 mg | ORAL_CAPSULE | Freq: Every day | ORAL | 2 refills | Status: DC

## 2016-08-03 NOTE — Telephone Encounter (Signed)
She can increase Trokendi to 100mg  at bedtime.

## 2016-08-03 NOTE — Telephone Encounter (Signed)
Rx sent to pharmacy. Mychart reply sent to pt.

## 2016-08-03 NOTE — Telephone Encounter (Signed)
Please see mychart message.

## 2016-08-05 ENCOUNTER — Encounter: Payer: Self-pay | Admitting: Adult Health

## 2016-08-05 ENCOUNTER — Ambulatory Visit: Payer: BC Managed Care – PPO

## 2016-08-05 ENCOUNTER — Ambulatory Visit (INDEPENDENT_AMBULATORY_CARE_PROVIDER_SITE_OTHER): Payer: BC Managed Care – PPO | Admitting: Adult Health

## 2016-08-05 VITALS — BP 102/64 | Ht 64.0 in | Wt 154.8 lb

## 2016-08-05 DIAGNOSIS — G43011 Migraine without aura, intractable, with status migrainosus: Secondary | ICD-10-CM

## 2016-08-05 MED ORDER — AMITRIPTYLINE HCL 25 MG PO TABS: 25.0000 mg | ORAL_TABLET | Freq: Every day | ORAL | 1 refills | Status: DC

## 2016-08-05 MED ORDER — METHYLPREDNISOLONE ACETATE 80 MG/ML IJ SUSP
80.0000 mg | Freq: Once | INTRAMUSCULAR | Status: AC
  Administered 2016-08-05: 80 mg via INTRAMUSCULAR

## 2016-08-05 MED ORDER — ALPRAZOLAM 0.5 MG PO TABS: ORAL_TABLET | ORAL | 0 refills | Status: DC

## 2016-08-05 MED ORDER — KETOROLAC TROMETHAMINE 60 MG/2ML IM SOLN
60.0000 mg | Freq: Once | INTRAMUSCULAR | Status: AC
  Administered 2016-08-05: 60 mg via INTRAMUSCULAR

## 2016-08-05 MED ORDER — AMITRIPTYLINE HCL 10 MG PO TABS: 25.0000 mg | ORAL_TABLET | Freq: Every day | ORAL | Status: DC

## 2016-08-05 NOTE — Progress Notes (Signed)
Subjective:    Patient ID: Samantha Hartman, female    DOB: January 18, 1986, 30 y.o.   MRN: [id number]  HPI  This is a 30 year old female patient of Dr. Tawanna Coolerodd who presents to the office today for consistent migraines. She reports that over the last 3 months she has had a migraine of some sort every single day. She is being seen by Dr. Everlena CooperJaffe at the headache clinic last saw him on 07/16/2016. At this time Dr. Everlena CooperJaffe started her on Trokendi XLR 50 mg at bedtime. For abortive therapy he increased the Imitrex to 100 mg to take with naproxen 500 mg. Today in the office she reports that nothing has helped her headaches and she feels as though they're becoming much worse. He also reports that she starting to become "depressed" from having a constant migraine headache.  She denies any blurred vision or aura.  Review of Systems  Constitutional: Negative.   Eyes: Positive for photophobia.  Respiratory: Negative.   Cardiovascular: Negative.   Gastrointestinal: Negative.   Neurological: Positive for headaches.   Past Medical History:  Diagnosis Date  . Asthma   . GERD (gastroesophageal reflux disease)   . Wears glasses     Social History   Social History  . Marital status: Married    Spouse name: N/A  . Number of children: N/A  . Years of education: N/A   Occupational History  . Not on file.   Social History Main Topics  . Smoking status: Never Smoker  . Smokeless tobacco: Never Used  . Alcohol use No  . Drug use: No  . Sexual activity: Yes    Birth control/ protection: IUD     Comment: 2014- placement Mirena IUD   Other Topics Concern  . Not on file   Social History Narrative  . No narrative on file    Past Surgical History:  Procedure Laterality Date  . APPENDECTOMY  April 2016  . LAPAROSCOPIC TUBAL LIGATION Bilateral 12/19/2015   Procedure: LAPAROSCOPIC BILATERAL TUBAL LIGATION;  Surgeon: Richardean ChimeraJohn McComb, MD;  [facility] [place];  Service: Gynecology;  Laterality:  Bilateral;    Family History  Problem Relation Age of Onset  . Stroke Mother   . Hypertension Mother   . Cancer Father     prostate    Allergies  Allergen Reactions  . Other Itching and Swelling    APPLES, PINEAPPLE, KIWI  Cause itchy throat and mouth swells    Current Outpatient Prescriptions on File Prior to Visit  Medication Sig Dispense Refill  . albuterol (PROVENTIL HFA;VENTOLIN HFA) 108 (90 Base) MCG/ACT inhaler Inhale 2 puffs into the lungs every 6 (six) hours as needed for wheezing or shortness of breath. 1 Inhaler 5  . ALBUTEROL IN Inhale into the lungs as needed. Reported on 02/03/2016    . calcium carbonate (TUMS - DOSED IN MG ELEMENTAL CALCIUM) 500 MG chewable tablet Chew 1 tablet by mouth as needed for indigestion or heartburn. Reported on 02/03/2016    . doxycycline (VIBRAMYCIN) 100 MG capsule Take 1 capsule (100 mg total) by mouth daily. 30 capsule 3  . fluticasone (FLONASE) 50 MCG/ACT nasal spray Place 2 sprays into both nostrils daily. 32 g 6  . levocetirizine (XYZAL) 5 MG tablet     . montelukast (SINGULAIR) 10 MG tablet Take 1 tablet (10 mg total) by mouth at bedtime. 100 tablet 3  . naproxen (NAPROSYN) 500 MG tablet Take 1 tablet (500 mg total) by mouth every 12 (twelve) hours  as needed. 16 tablet 2  . promethazine (PHENERGAN) 25 MG tablet Take 1 tablet (25 mg total) by mouth every 6 (six) hours as needed for nausea or vomiting. 30 tablet 0  . SUMAtriptan (IMITREX) 100 MG tablet Take 1 tab at earliest onset of headache.  May repeat once in 2 hours if headache persists or recurs. 10 tablet 2  . SUMAtriptan (IMITREX) 20 MG/ACT nasal spray Place 1 spray (20 mg) into the nose every 2 hours as needed for migraine or headache. May repeat in 2 hours if headache persists or recurs. 6 Inhaler 2  . Topiramate ER (TROKENDI XR) 50 MG CP24 Take 100 mg by mouth at bedtime. 60 capsule 2   No current facility-administered medications on file prior to visit.     BP 102/64   Ht 5'  4" (1.626 m)   Wt 154 lb 12.8 oz (70.2 kg)   BMI 26.57 kg/m       Objective:   Physical Exam  Constitutional: She is oriented to person, place, and time. She appears well-developed and well-nourished. No distress.  HENT:  Head: Normocephalic and atraumatic.  Right Ear: External ear normal.  Left Ear: External ear normal.  Nose: Nose normal.  Mouth/Throat: Oropharynx is clear and moist. No oropharyngeal exudate.  Eyes: Conjunctivae and EOM are normal. Pupils are equal, round, and reactive to light. Right eye exhibits no discharge. Left eye exhibits no discharge.  Cardiovascular: Normal rate, regular rhythm, normal heart sounds and intact distal pulses.  Exam reveals no gallop and no friction rub.   No murmur heard. Pulmonary/Chest: Effort normal and breath sounds normal. No respiratory distress. She has no wheezes. She has no rales. She exhibits no tenderness.  Musculoskeletal: She exhibits no edema, tenderness or deformity.  Neurological: She is alert and oriented to person, place, and time. She has normal reflexes.  Skin: Skin is warm and dry. No rash noted. She is not diaphoretic. No erythema. No pallor.  Psychiatric: She has a normal mood and affect. Her behavior is normal. Judgment and thought content normal.  Nursing note and vitals reviewed.     Assessment & Plan:  1. Intractable migraine without aura and with status migrainosus - Have her stop Toprol she no longer wants to take this medication. I will try amitriptyline 25 mg at night both an antidepressant and a migraine prophylactic medication. - amitriptyline (ELAVIL) tablet 25 mg; Take 2.5 tablets (25 mg total) by mouth at bedtime. - MR Brain Wo Contrast; Future - ketorolac (TORADOL) injection 60 mg; Inject 2 mLs (60 mg total) into the muscle once. - methylPREDNISolone acetate (DEPO-MEDROL) injection 80 mg; Inject 1 mL (80 mg total) into the muscle once. Follow-up as needed  Shirline Frees, NP

## 2016-08-05 NOTE — Telephone Encounter (Signed)
Please see message from pt. Pt wants come in for headache cocktail. Last one was given to her by employer on 07/31/16. Please advise.

## 2016-08-05 NOTE — Patient Instructions (Addendum)
Start Amitriptiline 25 mg at night. If after a few days you are not noticing any difference then increase to 50 mg  Someone will call you to schedule your MRI   Follow up as needed

## 2016-08-07 ENCOUNTER — Encounter: Payer: Self-pay | Admitting: Neurology

## 2016-08-07 NOTE — Telephone Encounter (Signed)
Pt was unable to come in the other day due to ride. Still having headache. Has ride today. Okay to give cocktail today?

## 2016-08-11 ENCOUNTER — Ambulatory Visit (INDEPENDENT_AMBULATORY_CARE_PROVIDER_SITE_OTHER): Payer: BC Managed Care – PPO | Admitting: Adult Health

## 2016-08-11 ENCOUNTER — Encounter: Payer: Self-pay | Admitting: Adult Health

## 2016-08-11 VITALS — BP 116/64 | Temp 98.1°F | Ht 64.0 in | Wt 154.0 lb

## 2016-08-11 DIAGNOSIS — Z7689 Persons encountering health services in other specified circumstances: Secondary | ICD-10-CM | POA: Diagnosis not present

## 2016-08-11 DIAGNOSIS — G43711 Chronic migraine without aura, intractable, with status migrainosus: Secondary | ICD-10-CM | POA: Diagnosis not present

## 2016-08-11 DIAGNOSIS — G43909 Migraine, unspecified, not intractable, without status migrainosus: Secondary | ICD-10-CM | POA: Insufficient documentation

## 2016-08-11 NOTE — Progress Notes (Signed)
Patient presents to clinic today to establish care. She is a pleasant 30 year old female who  has a past medical history of Asthma; GERD (gastroesophageal reflux disease); Migraines; and Wears glasses.   Acute Concerns: Establish Care   Chronic Issues: Migraines - She has tried multiple medications in the past, currently taking Topamax ER, Amitriptyline 50mg  at night and Imitrex as needed. She reports today that since taking Amitriptyline this is the best that she has felt. She was started on this medication last week by this Clinical research associatewriter. Her migraines have been slowly getting better and she reports that she has not had a headache for the last 2 days.   Health Maintenance: Dental -- Routine  Vision -- Routine  Immunizations -- UTD  PAP --  2016 - yearly   Is followed by  GYN- [facility]  Neurology - Dr. Adriana MccallumJaffee   Past Medical History:  Diagnosis Date  . Asthma   . GERD (gastroesophageal reflux disease)   . Wears glasses     Past Surgical History:  Procedure Laterality Date  . APPENDECTOMY  April 2016  . LAPAROSCOPIC TUBAL LIGATION Bilateral 12/19/2015   Procedure: LAPAROSCOPIC BILATERAL TUBAL LIGATION;  Surgeon: Richardean ChimeraJohn McComb, MD;  [facility] [place];  Service: Gynecology;  Laterality: Bilateral;    Current Outpatient Prescriptions on File Prior to Visit  Medication Sig Dispense Refill  . albuterol (PROVENTIL HFA;VENTOLIN HFA) 108 (90 Base) MCG/ACT inhaler Inhale 2 puffs into the lungs every 6 (six) hours as needed for wheezing or shortness of breath. 1 Inhaler 5  . ALBUTEROL IN Inhale into the lungs as needed. Reported on 02/03/2016    . ALPRAZolam (XANAX) 0.5 MG tablet Take one tab 60 minutes before procedure 2 tablet 0  . amitriptyline (ELAVIL) 25 MG tablet Take 1 tablet (25 mg total) by mouth at bedtime. 30 tablet 1  . calcium carbonate (TUMS - DOSED IN MG ELEMENTAL CALCIUM) 500 MG chewable tablet Chew 1 tablet by mouth as needed for indigestion or  heartburn. Reported on 02/03/2016    . doxycycline (VIBRAMYCIN) 100 MG capsule Take 1 capsule (100 mg total) by mouth daily. 30 capsule 3  . fluticasone (FLONASE) 50 MCG/ACT nasal spray Place 2 sprays into both nostrils daily. 32 g 6  . levocetirizine (XYZAL) 5 MG tablet     . montelukast (SINGULAIR) 10 MG tablet Take 1 tablet (10 mg total) by mouth at bedtime. 100 tablet 3  . naproxen (NAPROSYN) 500 MG tablet Take 1 tablet (500 mg total) by mouth every 12 (twelve) hours as needed. 16 tablet 2  . promethazine (PHENERGAN) 25 MG tablet Take 1 tablet (25 mg total) by mouth every 6 (six) hours as needed for nausea or vomiting. 30 tablet 0  . SUMAtriptan (IMITREX) 100 MG tablet Take 1 tab at earliest onset of headache.  May repeat once in 2 hours if headache persists or recurs. 10 tablet 2  . SUMAtriptan (IMITREX) 20 MG/ACT nasal spray Place 1 spray (20 mg) into the nose every 2 hours as needed for migraine or headache. May repeat in 2 hours if headache persists or recurs. 6 Inhaler 2  . Topiramate ER (TROKENDI XR) 50 MG CP24 Take 100 mg by mouth at bedtime. 60 capsule 2   No current facility-administered medications on file prior to visit.     Allergies  Allergen Reactions  . Other Itching and Swelling    APPLES, PINEAPPLE, KIWI  Cause itchy throat and mouth swells  Family History  Problem Relation Age of Onset  . Stroke Mother   . Hypertension Mother   . Cancer Father     prostate    Social History   Social History  . Marital status: Married    Spouse name: N/A  . Number of children: N/A  . Years of education: N/A   Occupational History  . Not on file.   Social History Main Topics  . Smoking status: Never Smoker  . Smokeless tobacco: Never Used  . Alcohol use No  . Drug use: No  . Sexual activity: Yes    Birth control/ protection: IUD     Comment: 2014- placement Mirena IUD   Other Topics Concern  . Not on file   Social History Narrative  . No narrative on file     Review of Systems  Constitutional: Negative.   Eyes: Negative.   Respiratory: Negative.   Cardiovascular: Negative.   Gastrointestinal: Negative.   Genitourinary: Negative.   Musculoskeletal: Negative.   Skin: Negative.   Neurological:       Headaches  Endo/Heme/Allergies: Negative.   Psychiatric/Behavioral: Negative.   All other systems reviewed and are negative.   BP 116/64   Temp 98.1 F (36.7 C) (Oral)   Ht 5\' 4"  (1.626 m)   Wt 154 lb (69.9 kg)   BMI 26.43 kg/m   Physical Exam  Constitutional: She is oriented to person, place, and time and well-developed, well-nourished, and in no distress. No distress.  HENT:  Head: Normocephalic and atraumatic.  Right Ear: External ear normal.  Left Ear: External ear normal.  Nose: Nose normal.  Mouth/Throat: Oropharynx is clear and moist. No oropharyngeal exudate.  Eyes: Conjunctivae and EOM are normal. Pupils are equal, round, and reactive to light. Right eye exhibits no discharge. Left eye exhibits no discharge. No scleral icterus.  Neck: Normal range of motion. Neck supple. No JVD present. No tracheal deviation present. No thyromegaly present.  Cardiovascular: Normal rate, regular rhythm, normal heart sounds and intact distal pulses.  Exam reveals no gallop and no friction rub.   No murmur heard. Pulmonary/Chest: Effort normal and breath sounds normal. No stridor. No respiratory distress. She has no wheezes. She has no rales. She exhibits no tenderness.  Musculoskeletal: Normal range of motion. She exhibits no edema, tenderness or deformity.  Lymphadenopathy:    She has no cervical adenopathy.  Neurological: She is alert and oriented to person, place, and time. She displays normal reflexes. No cranial nerve deficit. She exhibits normal muscle tone. Gait normal. Coordination normal. GCS score is 15.  Skin: Skin is warm and dry. No rash noted. She is not diaphoretic. No erythema. No pallor.  Psychiatric: Mood, memory, affect  and judgment normal.  Nursing note and vitals reviewed.  Assessment/Plan: 1. Encounter to establish care - Follow up in January for CPE - Follow up sooner if needed - Work on diet and exercise  2. Intractable chronic migraine without aura and with status migrainosus  - Continue with Elavil  50mg  QHS - In one week I would like her to start tapering off Topamax - Follow up with myself or Dr. Everlena CooperJaffe as needed - Has upcoming MRI   Shirline Freesory Dnasia Gauna, NP

## 2016-08-18 ENCOUNTER — Other Ambulatory Visit: Payer: Self-pay | Admitting: Adult Health

## 2016-08-20 ENCOUNTER — Inpatient Hospital Stay: Admission: RE | Admit: 2016-08-20 | Payer: BC Managed Care – PPO | Source: Ambulatory Visit

## 2016-08-24 ENCOUNTER — Encounter: Payer: Self-pay | Admitting: Adult Health

## 2016-08-25 ENCOUNTER — Other Ambulatory Visit: Payer: Self-pay

## 2016-08-25 MED ORDER — AMITRIPTYLINE HCL 50 MG PO TABS: 50.0000 mg | ORAL_TABLET | Freq: Every day | ORAL | 1 refills | Status: DC

## 2016-10-07 NOTE — Telephone Encounter (Signed)
Error

## 2016-10-20 ENCOUNTER — Encounter: Payer: Self-pay | Admitting: Adult Health

## 2016-10-22 ENCOUNTER — Ambulatory Visit (INDEPENDENT_AMBULATORY_CARE_PROVIDER_SITE_OTHER): Payer: BC Managed Care – PPO | Admitting: Adult Health

## 2016-10-22 ENCOUNTER — Encounter: Payer: Self-pay | Admitting: Adult Health

## 2016-10-22 VITALS — BP 124/72 | Temp 98.6°F | Ht 64.0 in | Wt 157.6 lb

## 2016-10-22 DIAGNOSIS — G43801 Other migraine, not intractable, with status migrainosus: Secondary | ICD-10-CM | POA: Diagnosis not present

## 2016-10-22 DIAGNOSIS — Z0000 Encounter for general adult medical examination without abnormal findings: Secondary | ICD-10-CM

## 2016-10-22 DIAGNOSIS — H8113 Benign paroxysmal vertigo, bilateral: Secondary | ICD-10-CM | POA: Diagnosis not present

## 2016-10-22 MED ORDER — NAPROXEN 500 MG PO TABS: 500.0000 mg | ORAL_TABLET | Freq: Two times a day (BID) | ORAL | 3 refills | Status: DC | PRN

## 2016-10-22 MED ORDER — MECLIZINE HCL 12.5 MG PO TABS: 12.5000 mg | ORAL_TABLET | Freq: Three times a day (TID) | ORAL | 0 refills | Status: DC | PRN

## 2016-10-22 NOTE — Progress Notes (Signed)
Subjective:    Patient ID: Samantha Hartman, female    DOB: 04-02-1986, 31 y.o.   MRN: [id number]  HPI Patient presents for yearly preventative medicine examination. She is a pleasant 31 year old female who  has a past medical history of Asthma; Breast mass; GERD (gastroesophageal reflux disease); Migraines; and Wears glasses.  All immunizations and health maintenance protocols were reviewed with the patient and needed orders were placed.  Appropriate screening laboratory values were ordered for the patient including screening of hyperlipidemia, renal function and hepatic function.  Medication reconciliation,  past medical history, social history, problem list and allergies were reviewed in detail with the patient  Goals were established with regard to weight loss, exercise, and  diet in compliance with medications.   She reports that over the last two weeks she has been having worsening migraines. Prior to this she was well controlled on Elavil 50 mg daily. Lately she has been having to use Imitrex.   She is also complaining of intermittent dizziness. She reports that the dizziness is more apparent when going from a laying to sitting or standing position. She also has this dizziness when turning her head from side to side. She feels as though the room is spinning during these episodes. Denies migraine during the episodes of dizziness.   She is having her eyes checked today. Has an appointment with GYN upcoming.    Review of Systems  Constitutional: Negative.   HENT: Negative.   Eyes: Negative.   Respiratory: Negative.   Gastrointestinal: Negative.   Endocrine: Negative.   Genitourinary: Negative.   Musculoskeletal: Negative.   Skin: Negative.   Allergic/Immunologic: Negative.   Neurological: Positive for dizziness and headaches. Negative for weakness and light-headedness.  Hematological: Negative.   Psychiatric/Behavioral: Negative.   All other systems reviewed and are  negative.  Past Medical History:  Diagnosis Date  . Asthma   . Breast mass   . GERD (gastroesophageal reflux disease)   . Migraines   . Wears glasses     Social History   Social History  . Marital status: Married    Spouse name: N/A  . Number of children: N/A  . Years of education: N/A   Occupational History  . Not on file.   Social History Main Topics  . Smoking status: Never Smoker  . Smokeless tobacco: Never Used  . Alcohol use No  . Drug use: No  . Sexual activity: Yes    Birth control/ protection: IUD     Comment: 2014- placement Mirena IUD   Other Topics Concern  . Not on file   Social History Narrative  . No narrative on file    Past Surgical History:  Procedure Laterality Date  . APPENDECTOMY  April 2016  . LAPAROSCOPIC TUBAL LIGATION Bilateral 12/19/2015   Procedure: LAPAROSCOPIC BILATERAL TUBAL LIGATION;  Surgeon: Richardean Chimera, MD;  Location: [facility];  Service: Gynecology;  Laterality: Bilateral;    Family History  Problem Relation Age of Onset  . Stroke Mother   . Hypertension Mother   . Prostate cancer Father     prostate  . Diabetes Mellitus II Father   . AAA (abdominal aortic aneurysm) Father     Allergies  Allergen Reactions  . Other Itching and Swelling    APPLES, PINEAPPLE, KIWI  Cause itchy throat and mouth swells    Current Outpatient Prescriptions on File Prior to Visit  Medication Sig Dispense Refill  . albuterol (PROVENTIL HFA;VENTOLIN HFA) 108 (90  Base) MCG/ACT inhaler Inhale 2 puffs into the lungs every 6 (six) hours as needed for wheezing or shortness of breath. 1 Inhaler 5  . ALBUTEROL IN Inhale into the lungs as needed. Reported on 02/03/2016    . ALPRAZolam (XANAX) 0.5 MG tablet Take one tab 60 minutes before procedure 2 tablet 0  . amitriptyline (ELAVIL) 50 MG tablet Take 1 tablet (50 mg total) by mouth at bedtime. 90 tablet 1  . calcium carbonate (TUMS - DOSED IN MG ELEMENTAL CALCIUM) 500 MG chewable  tablet Chew 1 tablet by mouth as needed for indigestion or heartburn. Reported on 02/03/2016    . fluticasone (FLONASE) 50 MCG/ACT nasal spray Place 2 sprays into both nostrils daily. 32 g 6  . levocetirizine (XYZAL) 5 MG tablet     . montelukast (SINGULAIR) 10 MG tablet Take 1 tablet (10 mg total) by mouth at bedtime. 100 tablet 3  . promethazine (PHENERGAN) 25 MG tablet Take 1 tablet (25 mg total) by mouth every 6 (six) hours as needed for nausea or vomiting. 30 tablet 0  . SUMAtriptan (IMITREX) 100 MG tablet Take 1 tab at earliest onset of headache.  May repeat once in 2 hours if headache persists or recurs. 10 tablet 2  . Topiramate ER (TROKENDI XR) 50 MG CP24 Take 100 mg by mouth at bedtime. 60 capsule 2   No current facility-administered medications on file prior to visit.     BP 124/72   Temp 98.6 F (37 C) (Oral)   Ht 5\' 4"  (1.626 m)   Wt 157 lb 9.6 oz (71.5 kg)   BMI 27.05 kg/m       Objective:   Physical Exam  Constitutional: She is oriented to person, place, and time. She appears well-developed and well-nourished. No distress.  HENT:  Head: Normocephalic and atraumatic.  Right Ear: Hearing, tympanic membrane, external ear and ear canal normal.  Left Ear: Hearing, tympanic membrane, external ear and ear canal normal.  Nose: Nose normal.  Mouth/Throat: Uvula is midline and mucous membranes are normal. No oropharyngeal exudate, posterior oropharyngeal edema, posterior oropharyngeal erythema or tonsillar abscesses.  Eyes: Conjunctivae are normal. Pupils are equal, round, and reactive to light. Right eye exhibits no discharge. Left eye exhibits no discharge. No scleral icterus. Right eye exhibits nystagmus (horizontal ). Left eye exhibits nystagmus (horizontal ).  Neck: Normal range of motion. Neck supple. No JVD present. No tracheal deviation present. No thyromegaly present.  Cardiovascular: Normal rate, regular rhythm, normal heart sounds and intact distal pulses.  Exam reveals  no gallop and no friction rub.   No murmur heard. Pulmonary/Chest: Effort normal and breath sounds normal. No stridor. No respiratory distress. She has no wheezes. She has no rales. She exhibits no tenderness.  Abdominal: Soft. Bowel sounds are normal. She exhibits no distension and no mass. There is no tenderness. There is no rebound and no guarding.  Genitourinary:  Genitourinary Comments: Deferred  Musculoskeletal: Normal range of motion. She exhibits no edema, tenderness or deformity.  Lymphadenopathy:    She has no cervical adenopathy.  Neurological: She is alert and oriented to person, place, and time. She has normal reflexes. She displays normal reflexes. No cranial nerve deficit. She exhibits normal muscle tone. Coordination normal.  Skin: Skin is warm and dry. No rash noted. She is not diaphoretic. No erythema. No pallor.  Psychiatric: She has a normal mood and affect. Her behavior is normal. Judgment and thought content normal.  Nursing note and vitals reviewed.  Assessment & Plan:   1. Routine general medical examination at a health care facility  - Basic metabolic panel - CBC with Differential/Platelet - Hepatic function panel - TSH - Magnesium - POCT Urinalysis Dipstick (Automated) - Lipid panel; Future - Lipid panel - Start eating healthy and exercising   2. Other migraine with status migrainosus, not intractable - I will have her increase Elavil to 100mg  daily. She will let me know if this works - naproxen (NAPROSYN) 500 MG tablet; Take 1 tablet (500 mg total) by mouth every 12 (twelve) hours as needed.  Dispense: 30 tablet; Refill: 3 -We have discussed CT or MRI of the head. She does not want to do that at this time 3. Benign paroxysmal positional vertigo due to bilateral vestibular disorder - meclizine (ANTIVERT) 12.5 MG tablet; Take 1 tablet (12.5 mg total) by mouth 3 (three) times daily as needed for dizziness.  Dispense: 30 tablet; Refill: 0 - Home  instructions for Eply maneuver given.  - Consider referral to Vestibular PT.   Shirline Freesory Rangel Echeverri, NP

## 2016-10-23 LAB — HEPATIC FUNCTION PANEL
ALK PHOS: 79 U/L (ref 39–117)
ALT: 17 U/L (ref 0–35)
AST: 15 U/L (ref 0–37)
Albumin: 4.4 g/dL (ref 3.5–5.2)
BILIRUBIN TOTAL: 0.5 mg/dL (ref 0.2–1.2)
Bilirubin, Direct: 0.1 mg/dL (ref 0.0–0.3)
Total Protein: 7.2 g/dL (ref 6.0–8.3)

## 2016-10-23 LAB — CBC WITH DIFFERENTIAL/PLATELET
BASOS ABS: 0 10*3/uL (ref 0.0–0.1)
Basophils Relative: 0.4 % (ref 0.0–3.0)
Eosinophils Absolute: 0.2 10*3/uL (ref 0.0–0.7)
Eosinophils Relative: 2.3 % (ref 0.0–5.0)
HCT: 38.4 % (ref 36.0–46.0)
Hemoglobin: 12.9 g/dL (ref 12.0–15.0)
LYMPHS ABS: 2 10*3/uL (ref 0.7–4.0)
Lymphocytes Relative: 31 % (ref 12.0–46.0)
MCHC: 33.7 g/dL (ref 30.0–36.0)
MCV: 90.6 fl (ref 78.0–100.0)
MONO ABS: 0.5 10*3/uL (ref 0.1–1.0)
Monocytes Relative: 7.6 % (ref 3.0–12.0)
NEUTROS ABS: 3.9 10*3/uL (ref 1.4–7.7)
NEUTROS PCT: 58.7 % (ref 43.0–77.0)
PLATELETS: 210 10*3/uL (ref 150.0–400.0)
RBC: 4.24 Mil/uL (ref 3.87–5.11)
RDW: 13.5 % (ref 11.5–15.5)
WBC: 6.6 10*3/uL (ref 4.0–10.5)

## 2016-10-23 LAB — BASIC METABOLIC PANEL
BUN: 8 mg/dL (ref 6–23)
CALCIUM: 9.5 mg/dL (ref 8.4–10.5)
CO2: 30 meq/L (ref 19–32)
CREATININE: 0.7 mg/dL (ref 0.40–1.20)
Chloride: 101 mEq/L (ref 96–112)
GFR: 104.8 mL/min (ref >60.00)
GLUCOSE: 91 mg/dL (ref 70–99)
Potassium: 4.4 mEq/L (ref 3.5–5.1)
Sodium: 138 mEq/L (ref 135–145)

## 2016-10-23 LAB — LIPID PANEL
CHOL/HDL RATIO: 3
Cholesterol: 208 mg/dL — ABNORMAL HIGH (ref 0–200)
HDL: 65.5 mg/dL (ref >39.00)
LDL CALC: 128 mg/dL — AB (ref 0–99)
NONHDL: 142.91
Triglycerides: 77 mg/dL (ref 0.0–149.0)
VLDL: 15.4 mg/dL (ref 0.0–40.0)

## 2016-10-23 LAB — TSH: TSH: 1.63 u[IU]/mL (ref 0.35–4.50)

## 2016-10-23 LAB — MAGNESIUM: Magnesium: 2.1 mg/dL (ref 1.5–2.5)

## 2016-10-28 ENCOUNTER — Ambulatory Visit: Payer: BC Managed Care – PPO | Admitting: Neurology

## 2016-11-19 ENCOUNTER — Emergency Department (HOSPITAL_COMMUNITY)
Admission: EM | Admit: 2016-11-19 | Discharge: 2016-11-19 | Disposition: A | Discharge status: Home / Self Care | Payer: BC Managed Care – PPO | Attending: Emergency Medicine | Admitting: Emergency Medicine

## 2016-11-19 ENCOUNTER — Encounter (HOSPITAL_COMMUNITY): Payer: Self-pay

## 2016-11-19 DIAGNOSIS — R42 Dizziness and giddiness: Secondary | ICD-10-CM | POA: Diagnosis not present

## 2016-11-19 DIAGNOSIS — Z79899 Other long term (current) drug therapy: Secondary | ICD-10-CM | POA: Diagnosis not present

## 2016-11-19 DIAGNOSIS — J45909 Unspecified asthma, uncomplicated: Secondary | ICD-10-CM | POA: Diagnosis not present

## 2016-11-19 LAB — BASIC METABOLIC PANEL
Anion gap: 6 (ref 5–15)
BUN: 6 mg/dL (ref 6–20)
CHLORIDE: 105 mmol/L (ref 101–111)
CO2: 28 mmol/L (ref 22–32)
Calcium: 9.2 mg/dL (ref 8.9–10.3)
Creatinine, Ser: 0.68 mg/dL (ref 0.44–1.00)
GFR calc Af Amer: >60 mL/min (ref >60)
GFR calc non Af Amer: >60 mL/min (ref >60)
GLUCOSE: 87 mg/dL (ref 65–99)
POTASSIUM: 3.9 mmol/L (ref 3.5–5.1)
SODIUM: 139 mmol/L (ref 135–145)

## 2016-11-19 LAB — CBC WITH DIFFERENTIAL/PLATELET
Basophils Absolute: 0 10*3/uL (ref 0.0–0.1)
Basophils Relative: 0 %
Eosinophils Absolute: 0.1 10*3/uL (ref 0.0–0.7)
Eosinophils Relative: 1 %
HEMATOCRIT: 38.5 % (ref 36.0–46.0)
HEMOGLOBIN: 12.9 g/dL (ref 12.0–15.0)
LYMPHS ABS: 2 10*3/uL (ref 0.7–4.0)
LYMPHS PCT: 35 %
MCH: 30.4 pg (ref 26.0–34.0)
MCHC: 33.5 g/dL (ref 30.0–36.0)
MCV: 90.8 fL (ref 78.0–100.0)
MONOS PCT: 6 %
Monocytes Absolute: 0.4 10*3/uL (ref 0.1–1.0)
NEUTROS ABS: 3.3 10*3/uL (ref 1.7–7.7)
NEUTROS PCT: 58 %
Platelets: 204 10*3/uL (ref 150–400)
RBC: 4.24 MIL/uL (ref 3.87–5.11)
RDW: 12.9 % (ref 11.5–15.5)
WBC: 5.8 10*3/uL (ref 4.0–10.5)

## 2016-11-19 LAB — I-STAT BETA HCG BLOOD, ED (MC, WL, AP ONLY): I-stat hCG, quantitative: <5 m[IU]/mL (ref <5)

## 2016-11-19 LAB — CBG MONITORING, ED: Glucose-Capillary: 73 mg/dL (ref 65–99)

## 2016-11-19 LAB — TSH: TSH: 1.272 u[IU]/mL (ref 0.350–4.500)

## 2016-11-19 MED ORDER — SODIUM CHLORIDE 0.9 % IV BOLUS (SEPSIS)
1000.0000 mL | Freq: Once | INTRAVENOUS | Status: AC
  Administered 2016-11-19: 1000 mL via INTRAVENOUS

## 2016-11-19 NOTE — ED Provider Notes (Signed)
[facility]-EMERGENCY DEPT Provider Note   CSN: [id number] Arrival date & time: 11/19/16  0930     History   Chief Complaint Chief Complaint  Patient presents with  . Nausea  . Dizziness    HPI Tine Mabee is a 31 y.o. female. CC:  dizziness  HPI:  Patient describes dizziness over the last 2 weeks.  First episode was watching a procedure of removal of a skin cyst at her job. 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 She denies history of vagal episodes or queasiness with side of blood or procedures. She states her doctor the next 8 over this might be vertigo and told to take meclizine. He states this felt like more like she was going to pass out denies any spinning or hallucination movement. She's had several more episodes of dizziness. Today had palpitations when driving her son to school. Became lightheaded. She drove her car over the edge of a curb. Has no pain. His symptom free upon her arrival here although she states she feels weak. No history of anemia. Has not been nauseated or vomiting. No history of cardiovascular disease or hyperthyroidism  Past Medical History:  Diagnosis Date  . Asthma   . Breast mass   . GERD (gastroesophageal reflux disease)   . Migraines   . Wears glasses     Patient Active Problem List   Diagnosis Date Noted  . Migraine 08/11/2016  . Left breast mass 02/03/2016  . Asthma with acute exacerbation 01/21/2016  . Vulvar abscess 10/30/2015  . Allergic rhinitis 10/21/2015  . Lesion of oral mucosa 08/26/2015    Past Surgical History:  Procedure Laterality Date  . APPENDECTOMY  April 2016  . LAPAROSCOPIC TUBAL LIGATION Bilateral 12/19/2015   Procedure: LAPAROSCOPIC BILATERAL TUBAL LIGATION;  Surgeon: Richardean Chimera, MD;  Location: [facility];  Service: Gynecology;  Laterality: Bilateral;    OB History    No data available       Home Medications    Prior to Admission medications   Medication Sig Start Date End Date Taking?  Authorizing Provider  albuterol (PROVENTIL HFA;VENTOLIN HFA) 108 (90 Base) MCG/ACT inhaler Inhale 2 puffs into the lungs every 6 (six) hours as needed for wheezing or shortness of breath. 01/21/16   Roderick Pee, MD  ALBUTEROL IN Inhale into the lungs as needed. Reported on 02/03/2016    Historical Provider, MD  ALPRAZolam Prudy Feeler) 0.5 MG tablet Take one tab 60 minutes before procedure 08/05/16   Shirline Frees, NP  amitriptyline (ELAVIL) 50 MG tablet Take 1 tablet (50 mg total) by mouth at bedtime. 08/25/16   Shirline Frees, NP  calcium carbonate (TUMS - DOSED IN MG ELEMENTAL CALCIUM) 500 MG chewable tablet Chew 1 tablet by mouth as needed for indigestion or heartburn. Reported on 02/03/2016    Historical Provider, MD  fluticasone (FLONASE) 50 MCG/ACT nasal spray Place 2 sprays into both nostrils daily. 02/03/16   Roderick Pee, MD  levocetirizine Elita Boone) 5 MG tablet  06/11/16   Historical Provider, MD  meclizine (ANTIVERT) 12.5 MG tablet Take 1 tablet (12.5 mg total) by mouth 3 (three) times daily as needed for dizziness. 10/22/16   Shirline Frees, NP  montelukast (SINGULAIR) 10 MG tablet Take 1 tablet (10 mg total) by mouth at bedtime. 01/27/16   Roderick Pee, MD  naproxen (NAPROSYN) 500 MG tablet Take 1 tablet (500 mg total) by mouth every 12 (twelve) hours as needed. 10/22/16   Shirline Frees, NP  promethazine (  PHENERGAN) 25 MG tablet Take 1 tablet (25 mg total) by mouth every 6 (six) hours as needed for nausea or vomiting. 05/08/16   Shirline Freesory Nafziger, NP  SUMAtriptan (IMITREX) 100 MG tablet Take 1 tab at earliest onset of headache.  May repeat once in 2 hours if headache persists or recurs. 07/16/16   Drema DallasAdam R Jaffe, DO  Topiramate ER (TROKENDI XR) 50 MG CP24 Take 100 mg by mouth at bedtime. 08/03/16   Drema DallasAdam R Jaffe, DO    Family History Family History  Problem Relation Age of Onset  . Stroke Mother   . Hypertension Mother   . Prostate cancer Father     prostate  . Diabetes Mellitus II Father   . AAA  (abdominal aortic aneurysm) Father     Social History Social History  Substance Use Topics  . Smoking status: Never Smoker  . Smokeless tobacco: Never Used  . Alcohol use No     Allergies   Other   Review of Systems Review of Systems  Constitutional: Negative for appetite change, chills, diaphoresis, fatigue and fever.  HENT: Negative for mouth sores, sore throat and trouble swallowing.   Eyes: Negative for visual disturbance.  Respiratory: Negative for cough, chest tightness, shortness of breath and wheezing.   Cardiovascular: Negative for chest pain.  Gastrointestinal: Negative for abdominal distention, abdominal pain, diarrhea, nausea and vomiting.  Endocrine: Negative for polydipsia, polyphagia and polyuria.  Genitourinary: Negative for dysuria, frequency and hematuria.  Musculoskeletal: Negative for gait problem.  Skin: Negative for color change, pallor and rash.  Neurological: Positive for dizziness and weakness. Negative for syncope, light-headedness and headaches.  Hematological: Does not bruise/bleed easily.  Psychiatric/Behavioral: Negative for behavioral problems and confusion.     Physical Exam Updated Vital Signs BP 101/89   Pulse 67   Resp 16   LMP 11/10/2016   SpO2 100%   Physical Exam  Constitutional: She is oriented to person, place, and time. She appears well-developed and well-nourished. No distress.  HENT:  Head: Normocephalic.  Eyes: Conjunctivae are normal. Pupils are equal, round, and reactive to light. No scleral icterus.  Neck: Normal range of motion. Neck supple. No thyromegaly present.  Cardiovascular: Normal rate and regular rhythm.  Exam reveals no gallop and no friction rub.   No murmur heard. Pulmonary/Chest: Effort normal and breath sounds normal. No respiratory distress. She has no wheezes. She has no rales.  Abdominal: Soft. Bowel sounds are normal. She exhibits no distension. There is no tenderness. There is no rebound.    Musculoskeletal: Normal range of motion.  Neurological: She is alert and oriented to person, place, and time.  Skin: Skin is warm and dry. No rash noted.  Psychiatric: She has a normal mood and affect. Her behavior is normal.     ED Treatments / Results  Labs (all labs ordered are listed, but only abnormal results are displayed) Labs Reviewed  CBC WITH DIFFERENTIAL/PLATELET  BASIC METABOLIC PANEL  TSH  I-STAT BETA HCG BLOOD, ED ([facility], [facility], [facility] ONLY)  CBG MONITORING, ED    EKG  EKG Interpretation None       Radiology No results found.  Procedures Procedures (including critical care time)  Medications Ordered in ED Medications  sodium chloride 0.9 % bolus 1,000 mL (0 mLs Intravenous Stopped 11/19/16 1306)     Initial Impression / Assessment and Plan / ED Course  I have reviewed the triage vital signs and the nursing notes.  Pertinent labs & imaging results that were  available during my care of the patient were reviewed by me and considered in my medical decision making (see chart for details).     Sinus rhythm. Normal labs. Asymptomatic here. Advised her not to drive until follow-up. Primary care follow-up and more with [facility] cardiology.   I recommended she follow with cardiology for Holter monitoring in the ER return with recurrence of palpitations or syncope.  Final Clinical Impressions(s) / ED Diagnoses   Final diagnoses:  Dizziness    New Prescriptions New Prescriptions   No medications on file     Rolland Porter, MD 11/19/16 1321

## 2016-11-19 NOTE — ED Triage Notes (Signed)
Pt states 2 days ago she had nausea.  Became dizzy and fell.  Doesn't remember anything.  Today her dizziness continues.  Nausea today.  Continues throughout day.  Frequent headaches.  No shortness of breath.

## 2016-11-19 NOTE — ED Notes (Signed)
Bed: WA22 Expected date:  Expected time:  Means of arrival:  Comments: 

## 2016-11-19 NOTE — Discharge Instructions (Signed)
No driving until follow up with PCP or Cardiologist. [facility] for follow up appointment (number and information above). Push fluids/stay hydrated.

## 2016-11-25 ENCOUNTER — Encounter: Payer: Self-pay | Admitting: Physician Assistant

## 2016-11-25 ENCOUNTER — Ambulatory Visit (INDEPENDENT_AMBULATORY_CARE_PROVIDER_SITE_OTHER): Payer: BC Managed Care – PPO | Admitting: Physician Assistant

## 2016-11-25 ENCOUNTER — Ambulatory Visit (INDEPENDENT_AMBULATORY_CARE_PROVIDER_SITE_OTHER): Payer: BC Managed Care – PPO

## 2016-11-25 VITALS — BP 110/79 | HR 101 | Ht 64.0 in | Wt 154.2 lb

## 2016-11-25 DIAGNOSIS — R002 Palpitations: Secondary | ICD-10-CM | POA: Diagnosis not present

## 2016-11-25 DIAGNOSIS — R55 Syncope and collapse: Secondary | ICD-10-CM | POA: Diagnosis not present

## 2016-11-25 DIAGNOSIS — R42 Dizziness and giddiness: Secondary | ICD-10-CM | POA: Diagnosis not present

## 2016-11-25 DIAGNOSIS — R0789 Other chest pain: Secondary | ICD-10-CM

## 2016-11-25 NOTE — Patient Instructions (Addendum)
Your physician has recommended that you wear an event monitor for 30 days - placed @ [address]. Event monitors are medical devices that record the heart's electrical activity. Doctors most often us these monitors to diagnose arrhythmias. Arrhythmias are problems with the speed or rhythm of the heartbeat. The monitor is a small, portable device. You can wear one while you do your normal daily activities. This is usually used to diagnose what is causing palpitations/syncope (passing out).  Your physician has requested that you have an echocardiogram @ [address]. Echocardiography is a painless test that uses sound waves to create images of your heart. It provides your doctor with information about the size and shape of your heart and how well your heart's chambers and valves are working. This procedure takes approximately one hour. There are no restrictions for this procedure.  Your physician recommends that you schedule a follow-up appointment after your testing with [place]onda, Samantha Hartman or Dr. Bryan Lemmaavid Hartman  Per [place] law (and [place], Samantha Hartman) - it is recommended that you DO NOT drive or operate a vehicle for 6 months after having a syncopal episode (passing out/fainting)  ** stay hydrated - drink a minimum of 8 glasses of water daily

## 2016-11-25 NOTE — Progress Notes (Signed)
Cardiology Office Note   Date:  11/25/2016   ID:  Samantha Hartman, DOB [birth year], MRN [id number]  PCP:  Shirline Frees, NP  Cardiologist:  Ellis Parents, Dr   Theodore Demark, PA-C   Chief Complaint  Patient presents with  . New Evaluation    complaints of chest pressure, palpitations, fainting since last week; dizziness & a little shortness of breath    History of Present Illness: Samantha Hartman is a 31 y.o. female with a history of GERD, migraines.  02/08 ER visit for dizziness, palpitations, weakness and appt made.  Samantha Hartman presents for cardiology evaluation.  Pt has had several episodes of feeling dizzy>>nausea>>then gets hot>>syncope.  She had had some dizziness that started a month or 2 ago. The first time was during a minor medical procedure at work. She got very dizzy, felt weak, felt hot, was diaphoretic, pale. She sat down, her CBG was ok, BP a little low. She rested and felt well. Since then, it has been happening 1/week. No nausea. Did not pass out. She felt her heart pound hard, but it was not irregular.  Starting 02/06, she would get the sx but lose consciousness at 2 am when she was feeling the dizziness, nausea and felt hot, heart pounding. She got up to get water and woke up on the floor. Her husband said she did not come around fully for 20 minutes. 02/08 same thing happened while she was driving. LOC was brief, did not lose control of the car, no post-episode sx. She went to the ER where labs (including TSH & pregnancy test) were unremarkable. Yesterday, she felt the same sx but sat down and was able avert syncope.   No previous episodes of vasovagal response. No recent illnesses or GI problems. She is otherwise in good health. She has been thirsty lately and drinks plenty of water.    Past Medical History:  Diagnosis Date  . Asthma   . Breast mass   . GERD (gastroesophageal reflux disease)   . Migraines   . Wears glasses     Past Surgical History:    Procedure Laterality Date  . APPENDECTOMY  April 2016  . LAPAROSCOPIC TUBAL LIGATION Bilateral 12/19/2015   Procedure: LAPAROSCOPIC BILATERAL TUBAL LIGATION;  Surgeon: Richardean Chimera, MD;  Location: [facility];  Service: Gynecology;  Laterality: Bilateral;    Medication Sig  . albuterol (PROVENTIL HFA;VENTOLIN HFA) 108 (90 Base) MCG/ACT inhaler Inhale 2 puffs into the lungs every 6 (six) hours as needed for wheezing or shortness of breath.  . ALBUTEROL IN Inhale into the lungs as needed. Reported on 02/03/2016  . amitriptyline (ELAVIL) 50 MG tablet Take 1 tablet (50 mg total) by mouth at bedtime.  . calcium carbonate (TUMS - DOSED IN MG ELEMENTAL CALCIUM) 500 MG chewable tablet Chew 1 tablet by mouth as needed for indigestion or heartburn. Reported on 02/03/2016  . fluticasone (FLONASE) 50 MCG/ACT nasal spray Place 2 sprays into both nostrils daily.  Marland Kitchen levocetirizine (XYZAL) 5 MG tablet   . meclizine (ANTIVERT) 12.5 MG tablet Take 1 tablet (12.5 mg total) by mouth 3 (three) times daily as needed for dizziness.  . naproxen (NAPROSYN) 500 MG tablet Take 1 tablet (500 mg total) by mouth every 12 (twelve) hours as needed.  . promethazine (PHENERGAN) 25 MG tablet Take 1 tablet (25 mg total) by mouth every 6 (six) hours as needed for nausea or vomiting.  . SUMAtriptan (IMITREX) 100 MG tablet Take 1 tab at earliest  onset of headache.  May repeat once in 2 hours if headache persists or recurs.   No current facility-administered medications for this visit.     Allergies:   Other    Social History:  The patient  reports that she has never smoked. She has never used smokeless tobacco. She reports that she does not drink alcohol or use drugs.   Family History:  The patient's family history includes Diabetes Mellitus II in her father; Hypertension in her mother; Prostate cancer in her father; Stroke (age of onset: 5846) in her mother.    ROS:  Please see the history of present illness. All  other systems are reviewed and negative.    PHYSICAL EXAM: VS:  BP 110/79 (BP Location: Left Arm, Patient Position: Sitting, Cuff Size: Normal)   Pulse (!) 101   Ht 5\' 4"  (1.626 m)   Wt 154 lb 3.2 oz (69.9 kg)   LMP 11/10/2016   BMI 26.47 kg/m  , BMI Body mass index is 26.47 kg/m. GEN: Well nourished, well developed, female in no acute distress  HEENT: normal for age  Neck: no JVD, no carotid bruit, no masses Cardiac: RRR; no murmur, no rubs, or gallops Respiratory:  clear to auscultation bilaterally, normal work of breathing GI: soft, nontender, nondistended, + BS MS: no deformity or atrophy; no edema; distal pulses are 2+ in all 4 extremities   Skin: warm and dry, no rash Neuro:  Strength and sensation are intact Psych: euthymic mood, full affect   EKG:  EKG is ordered today. The ekg ordered today demonstrates Sinus tach, HR101, no acute changes.   Recent Labs: 10/22/2016: ALT 17; Magnesium 2.1 11/19/2016: BUN 6; Creatinine, Ser 0.68; Hemoglobin 12.9; Platelets 204; Potassium 3.9; Sodium 139; TSH 1.272    Lipid Panel    Component Value Date/Time   CHOL 208 (H) 10/22/2016 1408   TRIG 77.0 10/22/2016 1408   HDL 65.50 10/22/2016 1408   CHOLHDL 3 10/22/2016 1408   VLDL 15.4 10/22/2016 1408   LDLCALC 128 (H) 10/22/2016 1408     Wt Readings from Last 3 Encounters:  11/25/16 154 lb 3.2 oz (69.9 kg)  10/22/16 157 lb 9.6 oz (71.5 kg)  08/11/16 154 lb (69.9 kg)     Other studies Reviewed: Additional studies/ records that were reviewed today include: ER notes and ECG.  ASSESSMENT AND PLAN: Pt and plan reviewed with Dr Herbie BaltimoreHarding, who agrees.  1.  Syncope: Causes include POTS, vasovagal, arrhythmia, seizures. Orthostatic VS were negative by BP today, but HR went from 94>>118 from sitting to standing. Her HR is elevated at baseline, even after sitting a while.   We will ck an echo and do an event monitor. If those are negative, discuss tilt table testing with EP. Pt encouraged  to drink > 8 glasses water daily and use salt as she wishes.   She was advised that [place] law recommends she not drive x 6 months.   Current medicines are reviewed at length with the patient today.  The patient does not have concerns regarding medicines.  The following changes have been made:  no change  Labs/ tests ordered today include:   Orders Placed This Encounter  Procedures  . Cardiac event monitor  . EKG 12-Lead  . ECHOCARDIOGRAM COMPLETE     Disposition:   FU with Dr Herbie BaltimoreHarding  Signed, Theodore DemarkBarrett, Kadedra Vanaken, PA-C  11/25/2016 9:40 AM    [facility] Phone: [phone]; Fax: [phone]  This note  was written with the assistance of speech recognition software. Please excuse any transcriptional errors.

## 2016-12-02 ENCOUNTER — Ambulatory Visit (HOSPITAL_COMMUNITY)
Admission: RE | Admit: 2016-12-02 | Discharge: 2016-12-02 | Disposition: A | Discharge status: Home / Self Care | Payer: BC Managed Care – PPO | Source: Ambulatory Visit | Attending: Physician Assistant | Admitting: Physician Assistant

## 2016-12-02 DIAGNOSIS — R0789 Other chest pain: Secondary | ICD-10-CM | POA: Diagnosis not present

## 2016-12-02 DIAGNOSIS — R42 Dizziness and giddiness: Secondary | ICD-10-CM | POA: Insufficient documentation

## 2016-12-02 DIAGNOSIS — R002 Palpitations: Secondary | ICD-10-CM | POA: Diagnosis not present

## 2016-12-02 DIAGNOSIS — R55 Syncope and collapse: Secondary | ICD-10-CM | POA: Insufficient documentation

## 2016-12-02 NOTE — Progress Notes (Signed)
  Echocardiogram 2D Echocardiogram has been performed.  Nolon RodBrown, Tony 12/02/2016, 9:07 AM

## 2016-12-17 ENCOUNTER — Encounter: Payer: Self-pay | Admitting: Adult Health

## 2016-12-17 ENCOUNTER — Ambulatory Visit (INDEPENDENT_AMBULATORY_CARE_PROVIDER_SITE_OTHER): Payer: BC Managed Care – PPO | Admitting: Adult Health

## 2016-12-17 VITALS — BP 124/68 | Temp 98.2°F | Ht 64.0 in | Wt 157.8 lb

## 2016-12-17 DIAGNOSIS — M5412 Radiculopathy, cervical region: Secondary | ICD-10-CM | POA: Diagnosis not present

## 2016-12-17 MED ORDER — CYCLOBENZAPRINE HCL 10 MG PO TABS: 10.0000 mg | ORAL_TABLET | Freq: Three times a day (TID) | ORAL | 0 refills | Status: DC | PRN

## 2016-12-17 MED ORDER — METHYLPREDNISOLONE 4 MG PO TBPK: ORAL_TABLET | ORAL | 0 refills | Status: DC

## 2016-12-17 NOTE — Progress Notes (Signed)
Subjective:    Patient ID: Annalese Stiner, female    DOB: 1985-11-18, 31 y.o.   MRN: [id number]  HPI  31 year old female who presents with less than 2 days of left shoulder pain. Pain is felt as a dull ache that is coming from inside the left shoulder. She does endorse that she feels as though her left arm has become weak over the past 24 hours.   She was unable to sleep on her left side last night do to pain.   Denies any trauma to the area.   Review of Systems .See HPI   Past Medical History:  Diagnosis Date  . Asthma   . Breast mass   . GERD (gastroesophageal reflux disease)   . Migraines   . Wears glasses     Social History   Social History  . Marital status: Married    Spouse name: N/A  . Number of children: N/A  . Years of education: N/A   Occupational History  . CMA [employer]   Social History Main Topics  . Smoking status: Never Smoker  . Smokeless tobacco: Never Used  . Alcohol use No  . Drug use: No  . Sexual activity: Yes    Birth control/ protection: IUD     Comment: 2014- placement Mirena IUD   Other Topics Concern  . Not on file   Social History Narrative  . No narrative on file    Past Surgical History:  Procedure Laterality Date  . APPENDECTOMY  April 2016  . LAPAROSCOPIC TUBAL LIGATION Bilateral 12/19/2015   Procedure: LAPAROSCOPIC BILATERAL TUBAL LIGATION;  Surgeon: Richardean Chimera, MD;  Location: [facility];  Service: Gynecology;  Laterality: Bilateral;    Family History  Problem Relation Age of Onset  . Stroke Mother 8  . Hypertension Mother   . Prostate cancer Father        . Diabetes Mellitus II Father     Allergies  Allergen Reactions  . Other Itching and Swelling    APPLES, PINEAPPLE, KIWI  Cause itchy throat and mouth swells    Current Outpatient Prescriptions on File Prior to Visit  Medication Sig Dispense Refill  . albuterol (PROVENTIL HFA;VENTOLIN HFA) 108 (90 Base) MCG/ACT inhaler Inhale 2 puffs into  the lungs every 6 (six) hours as needed for wheezing or shortness of breath. 1 Inhaler 5  . ALBUTEROL IN Inhale into the lungs as needed. Reported on 02/03/2016    . amitriptyline (ELAVIL) 50 MG tablet Take 1 tablet (50 mg total) by mouth at bedtime. 90 tablet 1  . calcium carbonate (TUMS - DOSED IN MG ELEMENTAL CALCIUM) 500 MG chewable tablet Chew 1 tablet by mouth as needed for indigestion or heartburn. Reported on 02/03/2016    . fluticasone (FLONASE) 50 MCG/ACT nasal spray Place 2 sprays into both nostrils daily. 32 g 6  . levocetirizine (XYZAL) 5 MG tablet     . meclizine (ANTIVERT) 12.5 MG tablet Take 1 tablet (12.5 mg total) by mouth 3 (three) times daily as needed for dizziness. 30 tablet 0  . naproxen (NAPROSYN) 500 MG tablet Take 1 tablet (500 mg total) by mouth every 12 (twelve) hours as needed. 30 tablet 3  . promethazine (PHENERGAN) 25 MG tablet Take 1 tablet (25 mg total) by mouth every 6 (six) hours as needed for nausea or vomiting. 30 tablet 0  . SUMAtriptan (IMITREX) 100 MG tablet Take 1 tab at earliest onset of headache.  May repeat once  in 2 hours if headache persists or recurs. 10 tablet 2   No current facility-administered medications on file prior to visit.     BP 124/68   Temp 98.2 F (36.8 C) (Oral)   Ht 5\' 4"  (1.626 m)   Wt 157 lb 12.8 oz (71.6 kg)   BMI 27.09 kg/m       Objective:   Physical Exam  Constitutional: She is oriented to person, place, and time. She appears well-developed and well-nourished.  Cardiovascular: Normal rate, regular rhythm, normal heart sounds and intact distal pulses.  Exam reveals no gallop and no friction rub.   No murmur heard. Pulmonary/Chest: Effort normal and breath sounds normal. No respiratory distress. She has no wheezes. She has no rales. She exhibits no tenderness.  Musculoskeletal: She exhibits tenderness (tenderness around  left scapula. No decrease in grip strength in left hand. ). She exhibits no edema or deformity.    Neurological: She is alert and oriented to person, place, and time. She has normal strength. No cranial nerve deficit or sensory deficit.  Skin: Skin is warm and dry. No rash noted. She is not diaphoretic. No erythema. No pallor.  Psychiatric: She has a normal mood and affect. Her behavior is normal. Judgment and thought content normal.  Nursing note and vitals reviewed.     Assessment & Plan:  1. Cervical radiculopathy - methylPREDNISolone (MEDROL DOSEPAK) 4 MG TBPK tablet; Take as directed  Dispense: 21 tablet; Refill: 0 - cyclobenzaprine (FLEXERIL) 10 MG tablet; Take 1 tablet (10 mg total) by mouth 3 (three) times daily as needed for muscle spasms.  Dispense: 30 tablet; Refill: 0 - Add motrin and heating pad - Follow up as needed  Shirline Freesory Lee Kuang, NP

## 2017-01-09 DIAGNOSIS — R55 Syncope and collapse: Secondary | ICD-10-CM | POA: Insufficient documentation

## 2017-01-09 DIAGNOSIS — R002 Palpitations: Secondary | ICD-10-CM | POA: Insufficient documentation

## 2017-01-09 NOTE — Progress Notes (Signed)
PCP: Shirline Frees, NP  Clinic Note: Chief Complaint  Patient presents with  . follow up    results, chest pain , shortness of breath, no swelling  . Loss of Consciousness    Dizziness, palpitations    HPI: Samantha Hartman is a 31 y.o. female with a PMH below who presents today for 1 month f/u for palpitations & dizziness.Marland Kitchen  Samantha Hartman was originally seen on Nov 25, 2016 by Theodore Demark, PA-C for an episode of dizziness, nausea followed by possible syncope. She noticed about 2 months history of dizziness. The initial event was after multiple minor medical procedure where she felt weak, hot and diaphoretic, but I would not pass down after she was able to sit down. She had an episode on February 6 which she says she lost consciousness. She had emergency room on February 8 where she had an unremarkable evaluation  Recent Hospitalizations: ER visit 11/19/2016  Studies Reviewed:  2-D Echocardiogram 12/02/2016: EF 60-65%. No mitral prolapse. Otherwise normal  Event Monitor: Mostly sinus rhythm with sinus tachycardia. Occasional PVCs. No arrhythmias.  Interval History: Samantha Hartman presents today overall doing fairly well, but states that she is still feeling dizzy episodes on, but has not had any further syncopal episodes. She still feels some palpitations, but nothing like she had before. She says she had one episode while driving when she felt dizzy and nauseated, and realized that she was not feeling well when she heard her son screaming. She did not lose consciousness and was able to get to the side of the road safely.  She has not had any sudden onset weakness or numbness, just globally feeling dizzy and woozy. She thought this was related to her migraines which have been much better controlled since starting the Imitrex. However, she is now having episodes again.  No chest pain or shortness of breath with rest or exertion. No PND, orthopnea or edema.   No claudication.  ROS: A  comprehensive was performed. Review of Systems  Constitutional: Negative for malaise/fatigue.  Respiratory: Negative.   Gastrointestinal: Negative for blood in stool.  Genitourinary: Negative for hematuria.  Musculoskeletal: Negative for falls.  Neurological: Positive for dizziness and headaches. Negative for loss of consciousness.  Psychiatric/Behavioral: The patient is nervous/anxious.   All other systems reviewed and are negative.   Past Medical History:  Diagnosis Date  . Asthma   . Breast mass   . GERD (gastroesophageal reflux disease)   . Migraines   . Wears glasses     Past Surgical History:  Procedure Laterality Date  . APPENDECTOMY  April 2016  . LAPAROSCOPIC TUBAL LIGATION Bilateral 12/19/2015   Procedure: LAPAROSCOPIC BILATERAL TUBAL LIGATION;  Surgeon: Richardean Chimera, MD;  Location: [facility];  Service: Gynecology;  Laterality: Bilateral;    Current Meds  Medication Sig  . albuterol (PROVENTIL HFA;VENTOLIN HFA) 108 (90 Base) MCG/ACT inhaler Inhale 2 puffs into the lungs every 6 (six) hours as needed for wheezing or shortness of breath.  . ALBUTEROL IN Inhale into the lungs as needed. Reported on 02/03/2016  . amitriptyline (ELAVIL) 50 MG tablet Take 1 tablet (50 mg total) by mouth at bedtime.  . calcium carbonate (TUMS - DOSED IN MG ELEMENTAL CALCIUM) 500 MG chewable tablet Chew 1 tablet by mouth as needed for indigestion or heartburn. Reported on 02/03/2016  . cyclobenzaprine (FLEXERIL) 10 MG tablet Take 1 tablet (10 mg total) by mouth 3 (three) times daily as needed for muscle spasms.  . fluticasone (FLONASE)  50 MCG/ACT nasal spray Place 2 sprays into both nostrils daily.  Marland Kitchen levocetirizine (XYZAL) 5 MG tablet   . meclizine (ANTIVERT) 12.5 MG tablet Take 1 tablet (12.5 mg total) by mouth 3 (three) times daily as needed for dizziness.  . naproxen (NAPROSYN) 500 MG tablet Take 1 tablet (500 mg total) by mouth every 12 (twelve) hours as needed.  .  promethazine (PHENERGAN) 25 MG tablet Take 1 tablet (25 mg total) by mouth every 6 (six) hours as needed for nausea or vomiting.  . SUMAtriptan (IMITREX) 100 MG tablet Take 1 tab at earliest onset of headache.  May repeat once in 2 hours if headache persists or recurs.    Allergies  Allergen Reactions  . Other Itching and Swelling    APPLES, PINEAPPLE, KIWI  Cause itchy throat and mouth swells    Social History   Social History  . Marital status: Married    Spouse name: N/A  . Number of children: N/A  . Years of education: N/A   Occupational History  . CMA [employer]   Social History Main Topics  . Smoking status: Never Smoker  . Smokeless tobacco: Never Used  . Alcohol use No  . Drug use: No  . Sexual activity: Yes    Birth control/ protection: IUD     Comment: 2014- placement Mirena IUD   Other Topics Concern  . None   Social History Narrative  . None    family history includes Diabetes Mellitus II in her father; Hypertension in her mother; Prostate cancer in her father; Stroke (age of onset: 51) in her mother.  Wt Readings from Last 3 Encounters:  01/11/17 165 lb 12.8 oz (75.2 kg)  12/17/16 157 lb 12.8 oz (71.6 kg)  11/25/16 154 lb 3.2 oz (69.9 kg)    PHYSICAL EXAM BP 110/80 (BP Location: Right Arm, Patient Position: Sitting, Cuff Size: Normal)   Pulse 60   Ht  (1.626 m)   Wt 165 lb 12.8 oz (75.2 kg)   BMI 28.46 kg/m  General appearance: alert, oriented, thought content appropriatecooperative, appears stated age, no distress and Borderline obese Neck: no adenopathy, no carotid bruit and no JVD Lungs: clear to auscultation bilaterally, normal percussion bilaterally and non-labored Heart: regular rate and rhythm, S1& S2 normal, no murmur, click, rub or gallop; nondisplaced PMI Abdomen: soft, non-tender; bowel sounds normal; no masses,  no organomegaly;  Extremities: extremities normal, atraumatic, no cyanosis, and edema    Adult ECG  Report n/a  Other studies Reviewed: Additional studies/ records that were reviewed today include:  Recent Labs:  n/a     ASSESSMENT / PLAN: Problem List Items Addressed This Visit    Palpitations    All I really saw a sinus tachycardia on monitor. Plan: Adequate hydration and liberalize salt intake.      Syncope and collapse    I really don't know what the etiology of her syncope is. She could have some neurocardiogenic syncope, but she really is having sudden onset episodes. No sensation to suggest palpitations.  Based on her echo, I don't see any structural other maladies, and her event monitor did not show any significant arrhythmias. Unfortunately she did not have episodes while wearing the monitor. She did feel dizzy and lightheaded associated with sinus tachycardia PVCs, and therefore encouraged her to stay adequately hydrated and liberalize her salt intake.   If she has another event, we may want to consider a loop recorder. Symptoms would probably not want a  tilt table.          Current medicines are reviewed at length with the patient today. (+/- concerns) n/a The following changes have been made: -->  Patient Instructions  No change to medications  KEEP HYDRATED Drink about 10- 12 glasses -a day  Use salt liberally  If any more  pass out spells;  Contact office   Your physician wants you to follow-up in 3 months with Dr Herbie Baltimore. You will receive a reminder letter in the mail two months in advance. If you don't receive a letter, please call our office to schedule the follow-up appointment.     Studies Ordered:   No orders of the defined types were placed in this encounter.     Bryan Lemma, M.D., M.S. Interventional Cardiologist   Pager # [phone] Phone # [phone] [address] [place], [place] [address]

## 2017-01-11 ENCOUNTER — Ambulatory Visit (INDEPENDENT_AMBULATORY_CARE_PROVIDER_SITE_OTHER): Payer: BC Managed Care – PPO | Admitting: Cardiology

## 2017-01-11 ENCOUNTER — Encounter: Payer: Self-pay | Admitting: Cardiology

## 2017-01-11 ENCOUNTER — Encounter: Payer: Self-pay | Admitting: Adult Health

## 2017-01-11 DIAGNOSIS — R55 Syncope and collapse: Secondary | ICD-10-CM

## 2017-01-11 DIAGNOSIS — R002 Palpitations: Secondary | ICD-10-CM | POA: Diagnosis not present

## 2017-01-11 NOTE — Patient Instructions (Addendum)
No change to medications  KEEP HYDRATED Drink about 10- 12 glasses -a day  Use salt liberally  If any more  pass out spells;  Contact office   Your physician wants you to follow-up in 3 months with Dr Herbie Baltimore. You will receive a reminder letter in the mail two months in advance. If you don't receive a letter, please call our office to schedule the follow-up appointment.

## 2017-01-11 NOTE — Assessment & Plan Note (Signed)
All I really saw a sinus tachycardia on monitor. Plan: Adequate hydration and liberalize salt intake.

## 2017-01-11 NOTE — Assessment & Plan Note (Signed)
I really don't know what the etiology of her syncope is. She could have some neurocardiogenic syncope, but she really is having sudden onset episodes. No sensation to suggest palpitations.  Based on her echo, I don't see any structural other maladies, and her event monitor did not show any significant arrhythmias. Unfortunately she did not have episodes while wearing the monitor. She did feel dizzy and lightheaded associated with sinus tachycardia PVCs, and therefore encouraged her to stay adequately hydrated and liberalize her salt intake.   If she has another event, we may want to consider a loop recorder. Symptoms would probably not want a tilt table.

## 2017-01-14 ENCOUNTER — Encounter: Payer: Self-pay | Admitting: Cardiology

## 2017-01-14 ENCOUNTER — Other Ambulatory Visit: Payer: Self-pay | Admitting: Adult Health

## 2017-01-14 ENCOUNTER — Telehealth: Payer: Self-pay | Admitting: *Deleted

## 2017-01-14 DIAGNOSIS — M25512 Pain in left shoulder: Secondary | ICD-10-CM

## 2017-01-14 DIAGNOSIS — R079 Chest pain, unspecified: Secondary | ICD-10-CM

## 2017-01-14 NOTE — Telephone Encounter (Signed)
Received email from patient:  Samantha Hartman  to Marykay Lex, MD   4/Saidi SantacroceM  Good afternoon Dr Herbie Baltimore! I haven't feel good today i been having chest pain in my left side. I did check my BP and it was 140/81 HR 121 that was like at 2:00 pm then I checked again at 4:00 and it was 135/81 HR 114. I been drinking plenty of water. So I don't know what it is. Thanks!    Called and spoke to patient: reports chest pain beginning earlier today when at work, patient was walking when chest pain began, did not resolve with rest, currently 2/10.  Describes pain as a sharp pain located left side of chest.  Denies dizziness, N/V, diaphoresis.  Reports BP readings: 140/81, 135/81, HR 115, reports a little SOB.  Recent OV with Dr. Herbie Baltimore 4/2 for palpitations and syncope.  Reports staying well hydrated as instructed.    Spoke to Dr. Herbie Baltimore DOD-advised chest pain does not sound cardiac in nature-advised GXT to rule out.  Also advised patient if pain continues or increases proceed to ER for further evaluation.    Also advised patient in the future please call with urgent concerns as we may not see MyChart emails quick enough to address urgent concerns.  Advised MyChart is for nonurgent concerns in the future.  Patient verbalized understanding.   GXT ordered and scheduled for 4/10 at 9:45 AM at [facility]. Advised no caffeine 12 hours prior to test. Patient aware and verbalized understanding.

## 2017-01-15 ENCOUNTER — Ambulatory Visit (INDEPENDENT_AMBULATORY_CARE_PROVIDER_SITE_OTHER)
Admission: RE | Admit: 2017-01-15 | Discharge: 2017-01-15 | Disposition: A | Discharge status: Home / Self Care | Payer: BC Managed Care – PPO | Source: Ambulatory Visit | Attending: Adult Health | Admitting: Adult Health

## 2017-01-15 ENCOUNTER — Telehealth (HOSPITAL_COMMUNITY): Payer: Self-pay

## 2017-01-15 DIAGNOSIS — M25512 Pain in left shoulder: Secondary | ICD-10-CM | POA: Diagnosis not present

## 2017-01-15 NOTE — Telephone Encounter (Signed)
Encounter complete. 

## 2017-01-19 ENCOUNTER — Ambulatory Visit (HOSPITAL_COMMUNITY)
Admission: RE | Admit: 2017-01-19 | Discharge: 2017-01-19 | Disposition: A | Discharge status: Home / Self Care | Payer: BC Managed Care – PPO | Source: Ambulatory Visit | Attending: Cardiovascular Disease | Admitting: Cardiovascular Disease

## 2017-01-19 DIAGNOSIS — R079 Chest pain, unspecified: Secondary | ICD-10-CM | POA: Insufficient documentation

## 2017-01-19 LAB — EXERCISE TOLERANCE TEST
CHL RATE OF PERCEIVED EXERTION: 16
CSEPED: 7 min
CSEPEDS: 57 s
CSEPEW: 10 METS
MPHR: 191 {beats}/min
Peak HR: 184 {beats}/min
Percent HR: 96 %
Rest HR: 114 {beats}/min

## 2017-01-21 ENCOUNTER — Encounter: Payer: Self-pay | Admitting: Adult Health

## 2017-01-22 ENCOUNTER — Other Ambulatory Visit: Payer: Self-pay

## 2017-01-22 MED ORDER — LEVOCETIRIZINE DIHYDROCHLORIDE 5 MG PO TABS: 5.0000 mg | ORAL_TABLET | Freq: Every evening | ORAL | 1 refills | Status: DC

## 2017-01-26 ENCOUNTER — Encounter: Payer: Self-pay | Admitting: Adult Health

## 2017-01-26 ENCOUNTER — Ambulatory Visit (INDEPENDENT_AMBULATORY_CARE_PROVIDER_SITE_OTHER): Payer: BC Managed Care – PPO | Admitting: Adult Health

## 2017-01-26 VITALS — BP 110/84 | HR 103 | Temp 97.9°F | Ht 64.0 in | Wt 161.4 lb

## 2017-01-26 DIAGNOSIS — M25512 Pain in left shoulder: Secondary | ICD-10-CM | POA: Diagnosis not present

## 2017-01-26 MED ORDER — METHYLPREDNISOLONE ACETATE 80 MG/ML IJ SUSP
80.0000 mg | Freq: Once | INTRAMUSCULAR | Status: AC
  Administered 2017-01-26: 80 mg via INTRA_ARTICULAR

## 2017-01-26 NOTE — Progress Notes (Signed)
Pre visit review using our clinic review tool, if applicable. No additional management support is needed unless otherwise documented below in the visit note. 

## 2017-01-26 NOTE — Progress Notes (Signed)
Subjective:    Patient ID: Samantha Hartman, female    DOB: [birth year], 31 y.o.   MRN: [id number]  HPI  31 year old female who presents to the office today for continued left shoulder pain. I first saw her for this on 12/17/2016 and she was prescribed a dose of prednisone and flexeril. This combination of medications helped but her relief went away as soon as the medication was finished.    The pain is felt as a dull ache and that it is " coming from inside the shoulder."   She does endorse weakness in her right arm on occasion.   Denies any CP or SOB  Review of Systems See HPI   Past Medical History:  Diagnosis Date  . Asthma   . Breast mass   . GERD (gastroesophageal reflux disease)   . Migraines   . Wears glasses     Social History   Social History  . Marital status: Married    Spouse name: N/A  . Number of children: N/A  . 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 Smoking status: Never Smoker  . Smokeless tobacco: Never Used  . Alcohol use No  . Drug use: No  . Sexual activity: Yes    Birth control/ protection: IUD     Comment: 2014- placement Mirena IUD   Other Topics Concern  . Not on file   Social History Narrative  . No narrative on file    Past Surgical History:  Procedure Laterality Date  . APPENDECTOMY  April 2016  . LAPAROSCOPIC TUBAL LIGATION Bilateral 12/19/2015   Procedure: LAPAROSCOPIC BILATERAL TUBAL LIGATION;  Surgeon: Richardean Chimera, MD;  Location: [facility];  Service: Gynecology;  Laterality: Bilateral;    Family History  Problem Relation Age of Onset  . Stroke Mother 42  . Hypertension Mother   . Prostate cancer Father        . Diabetes Mellitus II Father     Allergies  Allergen Reactions  . Other Itching and Swelling    APPLES, PINEAPPLE, KIWI  Cause itchy throat and mouth swells    Current Outpatient Prescriptions on File Prior to Visit  Medication Sig  Dispense Refill  . albuterol (PROVENTIL HFA;VENTOLIN HFA) 108 (90 Base) MCG/ACT inhaler Inhale 2 puffs into the lungs every 6 (six) hours as needed for wheezing or shortness of breath. 1 Inhaler 5  . ALBUTEROL IN Inhale into the lungs as needed. Reported on 02/03/2016    . amitriptyline (ELAVIL) 50 MG tablet Take 1 tablet (50 mg total) by mouth at bedtime. 90 tablet 1  . calcium carbonate (TUMS - DOSED IN MG ELEMENTAL CALCIUM) 500 MG chewable tablet Chew 1 tablet by mouth as needed for indigestion or heartburn. Reported on 02/03/2016    . cyclobenzaprine (FLEXERIL) 10 MG tablet Take 1 tablet (10 mg total) by mouth 3 (three) times daily as needed for muscle spasms. 30 tablet 0  . fluticasone (FLONASE) 50 MCG/ACT nasal spray Place 2 sprays into both nostrils daily. 32 g 6  . levocetirizine (XYZAL) 5 MG tablet Take 1 tablet (5 mg total) by mouth every evening. 90 tablet 1  . meclizine (ANTIVERT) 12.5 MG tablet Take 1 tablet (12.5 mg total) by mouth 3 (three) times daily as needed for dizziness. 30 tablet 0  . naproxen (NAPROSYN) 500 MG tablet Take 1 tablet (500 mg total) by mouth every 12 (twelve)  hours as needed. 30 tablet 3  . promethazine (PHENERGAN) 25 MG tablet Take 1 tablet (25 mg total) by mouth every 6 (six) hours as needed for nausea or vomiting. 30 tablet 0  . SUMAtriptan (IMITREX) 100 MG tablet Take 1 tab at earliest onset of headache.  May repeat once in 2 hours if headache persists or recurs. 10 tablet 2   No current facility-administered medications on file prior to visit.     BP 110/84 (BP Location: Left Arm, Patient Position: Sitting, Cuff Size: Normal)   Pulse (!) 103   Temp 97.9 F (36.6 C) (Oral)   Ht  (1.626 m)   Wt 161 lb 6.4 oz (73.2 kg)   SpO2 98%   BMI 27.70 kg/m       Objective:   Physical Exam  Constitutional: She is oriented to person, place, and time. She appears well-developed and well-nourished. No distress.  Neck: Trachea normal and normal range of  motion. Neck supple.  Cardiovascular: Normal rate, regular rhythm, normal heart sounds and intact distal pulses.  Exam reveals no gallop and no friction rub.   No murmur heard. Pulmonary/Chest: Effort normal and breath sounds normal. No respiratory distress. She has no wheezes. She has no rales. She exhibits no tenderness.  Musculoskeletal: Normal range of motion. She exhibits no edema, tenderness or deformity.  Pain with palpation along left scapula   Neurological: She is alert and oriented to person, place, and time. She has normal reflexes. She displays normal reflexes. No cranial nerve deficit. She exhibits normal muscle tone. Coordination normal.  Skin: Skin is warm and dry. No rash noted. She is not diaphoretic. No erythema. No pallor.  Psychiatric: She has a normal mood and affect. Her behavior is normal. Judgment and thought content normal.  Nursing note and vitals reviewed.     Assessment & Plan:  1. Left shoulder pain, unspecified chronicity Shoulder injection Verbal consent obtained and verified. Sterile betadine prep. Furthur cleansed with alcohol. Topical analgesic spray: Ethyl chloride. Joint: Left subacromial injection Approached in typical fashion with: posterior approach Completed without difficulty Meds: 3 cc lidocaine 2% no epi, 1 cc depomedrol /cc Needle:1.5 inch 25 gauge Aftercare instructions and Red flags advised.  - methylPREDNISolone acetate (DEPO-MEDROL) injection 80 mg; Inject 1 mL (80 mg total) into the articular space once.   Shirline Frees, NP

## 2017-03-17 ENCOUNTER — Ambulatory Visit (INDEPENDENT_AMBULATORY_CARE_PROVIDER_SITE_OTHER): Payer: BC Managed Care – PPO | Admitting: Allergy

## 2017-03-17 ENCOUNTER — Encounter: Payer: Self-pay | Admitting: Allergy

## 2017-03-17 ENCOUNTER — Ambulatory Visit: Payer: Self-pay | Admitting: Allergy

## 2017-03-17 VITALS — BP 108/76 | HR 90 | Temp 98.6°F | Resp 14 | Ht 64.5 in | Wt 160.8 lb

## 2017-03-17 DIAGNOSIS — J301 Allergic rhinitis due to pollen: Secondary | ICD-10-CM

## 2017-03-17 DIAGNOSIS — R21 Rash and other nonspecific skin eruption: Secondary | ICD-10-CM | POA: Diagnosis not present

## 2017-03-17 DIAGNOSIS — H1013 Acute atopic conjunctivitis, bilateral: Secondary | ICD-10-CM

## 2017-03-17 DIAGNOSIS — J4540 Moderate persistent asthma, uncomplicated: Secondary | ICD-10-CM | POA: Diagnosis not present

## 2017-03-17 DIAGNOSIS — T781XXD Other adverse food reactions, not elsewhere classified, subsequent encounter: Secondary | ICD-10-CM | POA: Diagnosis not present

## 2017-03-17 MED ORDER — BECLOMETHASONE DIPROP HFA 80 MCG/ACT IN AERB: 2.0000 | INHALATION_SPRAY | Freq: Two times a day (BID) | RESPIRATORY_TRACT | 5 refills | Status: DC

## 2017-03-17 MED ORDER — MONTELUKAST SODIUM 10 MG PO TABS: 10.0000 mg | ORAL_TABLET | Freq: Every day | ORAL | 5 refills | Status: DC

## 2017-03-17 MED ORDER — OLOPATADINE HCL 0.2 % OP SOLN: 1.0000 [drp] | Freq: Every day | OPHTHALMIC | 5 refills | Status: DC

## 2017-03-17 MED ORDER — AZELASTINE-FLUTICASONE 137-50 MCG/ACT NA SUSP: 1.0000 | Freq: Two times a day (BID) | NASAL | 5 refills | Status: DC

## 2017-03-17 MED ORDER — TRIAMCINOLONE ACETONIDE 0.5 % EX OINT: 1.0000 "application " | TOPICAL_OINTMENT | Freq: Two times a day (BID) | CUTANEOUS | 3 refills | Status: DC

## 2017-03-17 NOTE — Patient Instructions (Addendum)
Allergic rhinoconjunctivitis    - Allergy testing was positive for weeds, trees, grasses, dust mite, molds, cat, cockroach    - For nasal congestion and drainage will have you trial Dysmista (provided with sample today).  Use 1 spray each nostril twice a day.  Hold your fluticasone nasal spray while you are on Dysmista    -  Continue Xyzal in PM and change Zyrtec to Allegra in the AM    - continue  Singulair 10 mg daily - take at bedtime    - Use Pataday 1 drop each eye as needed for itchy, watery, red eyes    - Discussed allergen immunotherapy as potential treatment option including benefits and risks and build-up protocol.  Packet provided.  Let us know if you would like to proceed with this option.   AuviQ epinephrine device demonstrated (if you do proceed with allergy injections will prescribe this device)  Asthma    - Have access to albuterol inhaler 2 puffs every 4-6 hours as needed for cough, wheeze, shortness of breath or chest tightness. Monitor frequency of use    - During your most symptomatic allergy seasons (spring and fall) use Qvar Redihaler 80mcg 2 puffs twice a day.    Asthma control goals:   Full participation in all desired activities (may need albuterol before activity)  Albuterol use two time or less a week on average (not counting use with activity)  Cough interfering with sleep two time or less a month  Oral steroids no more than once a year  No hospitalizations Let us know if you are not meeting the above goals  Rash  - Rash has some features of eczema and some features of possible psoriasis  - We'll treat with triamcinolone 0.5% ointment.  Apply thin layer to affected areas twice a day until improve  - Moisturization of the areas at least 1-2 times a day with emollients like Aquaphor, Eucerin, CeraVe, Vaseline.    Pollen food allergy syndrome     - Continue avoidance of fresh apple, strawberry, kiwi, pineapple, cherry due to oral symptoms following ingestion.    Skin testing to these fruits is negative for apple, strawberry and pineapple   Follow-up in 4-6 months or sooner if needed

## 2017-03-17 NOTE — Progress Notes (Signed)
New Patient Note  RE: Samantha Hartman MRN: [id number] DOB: [birth year] Date of Office Visit: 03/17/2017  Referring provider: Shirline Frees, NP Primary care provider: Shirline Frees, NP  Chief Complaint: asthma, allergies and rash  History of present illness: Samantha Hartman is a 31 y.o. female presenting today for consultation for asthma, allergies and rash.     She reports about a month ago her allergies were much worse than usual.  She reports runny nose, itchy eyes, periorbital swelling.  Symptoms are worse in the spring and fall.  She is using Xyzal in PM and zyrtec in the AM but feels it has not been working as well.  She has used OTC [place] eye drops.  She also uses fluticasone 1 spray each nostril twice a day.  She did get a steroid injection about 3 weeks ago from PCP due to the eye swelling and significant nasal symptoms and associated asthma symptoms.  She did have allergy testing done about 4 years ago and is now interested in allergen immunotherapy as a treatment option.    She has asthma that was diagnosed in childhood.  She report her allergies are trigger of her asthma symptoms.  Last month she needed to use her albuterol multiple times a day.  She has symptoms of cough, wheezing, SOB.  Symptoms did improve after the steroid injection. Otherwise she denies use albuterol during summer or winter when her allergy symptoms are controlled. She has never required hospitalization and has not had any recent ED or urgent care visits for her symptoms. She denies any nighttime awakenings.  She also is having a rash that is new for the past 2 weeks.  Rash is very itchy.  She has been using hydrocortisone which has not been helpful.  She denies any change in soaps/lotions/detergents, no bites or stings and no outdoor exposures.  No similar household rashes.   Apples, pineapple, kiwi, cherry, strawberry she reports mouth itchiness, lip swelling, throat itchiness and feel like her throat is  closing.  She reports she can eat cooked apples without problems.    Review of systems: Review of Systems  Constitutional: Negative for chills, fever and malaise/fatigue.  HENT: Positive for congestion and sore throat. Negative for ear discharge, ear pain, nosebleeds, sinus pain and tinnitus.   Eyes: Negative for discharge and redness.  Respiratory: Positive for cough, shortness of breath and wheezing.   Cardiovascular: Negative for chest pain.  Gastrointestinal: Negative for abdominal pain, diarrhea, heartburn, nausea and vomiting.  Musculoskeletal: Negative for joint pain and myalgias.  Skin: Positive for itching and rash.  Neurological: Negative for headaches.    All other systems negative unless noted above in HPI  Past medical history: Past Medical History:  Diagnosis Date  . Angio-edema   . Asthma   . Breast mass   . GERD (gastroesophageal reflux disease)   . Migraines   . Wears glasses     Past surgical history: Past Surgical History:  Procedure Laterality Date  . APPENDECTOMY  April 2016  . LAPAROSCOPIC TUBAL LIGATION Bilateral 12/19/2015   Procedure: LAPAROSCOPIC BILATERAL TUBAL LIGATION;  Surgeon: Richardean Chimera, MD;  Location: [facility];  Service: Gynecology;  Laterality: Bilateral;    Family history:  Family History  Problem Relation Age of Onset  . Stroke Mother 87  . Hypertension Mother   . Prostate cancer Father           . Diabetes Mellitus II Father   . Asthma Father   .  Asthma Maternal Grandfather   . Allergic rhinitis Neg Hx   . Angioedema Neg Hx   . Atopy Neg Hx   . Eczema Neg Hx   . Immunodeficiency Neg Hx   . Urticaria Neg Hx     Social history: She lives in an apartment with carpeting with electric heating and central cooling. There are no pets in the home. There are dogs in her apartment complex. There is no concern for water damage, mildew or roaches in the home. 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  She has no smoking  history.   Medication List: Allergies as of 03/17/2017      Reactions   Other Itching, Swelling   APPLES, PINEAPPLE, KIWI  Cause itchy throat and mouth swells   Percocet [oxycodone-acetaminophen] Itching      Medication List       Accurate as of 03/17/17  1:10 PM. Always use your most recent med list.          albuterol 108 (90 Base) MCG/ACT inhaler Commonly known as:  PROVENTIL HFA;VENTOLIN HFA Inhale 2 puffs into the lungs every 6 (six) hours as needed for wheezing or shortness of breath.   amitriptyline 50 MG tablet Commonly known as:  ELAVIL Take 1 tablet (50 mg total) by mouth at bedtime.   cyclobenzaprine 10 MG tablet Commonly known as:  FLEXERIL Take 1 tablet (10 mg total) by mouth 3 (three) times daily as needed for muscle spasms.   fluticasone 50 MCG/ACT nasal spray Commonly known as:  FLONASE Place 2 sprays into both nostrils daily.   levocetirizine 5 MG tablet Commonly known as:  XYZAL Take 1 tablet (5 mg total) by mouth every evening.   meclizine 12.5 MG tablet Commonly known as:  ANTIVERT Take 1 tablet (12.5 mg total) by mouth 3 (three) times daily as needed for dizziness.   montelukast 10 MG tablet Commonly known as:  SINGULAIR   naproxen 500 MG tablet Commonly known as:  NAPROSYN Take 1 tablet (500 mg total) by mouth every 12 (twelve) hours as needed.   promethazine 25 MG tablet Commonly known as:  PHENERGAN Take 1 tablet (25 mg total) by mouth every 6 (six) hours as needed for nausea or vomiting.   SUMAtriptan 100 MG tablet Commonly known as:  IMITREX Take 1 tab at earliest onset of headache.  May repeat once in 2 hours if headache persists or recurs.       Known medication allergies: Allergies  Allergen Reactions  . Other Itching and Swelling    APPLES, PINEAPPLE, KIWI  Cause itchy throat and mouth swells  . Percocet [Oxycodone-Acetaminophen] Itching     Physical examination: Blood pressure 108/76, pulse 90, temperature 98.6 F (37  C), temperature source Oral, resp. rate 14, height 5' 4.5" (1.638 m), weight 160 lb 12.8 oz (72.9 kg), SpO2 98 %.  General: Alert, interactive, in no acute distress. HEENT: PERRLA, TMs pearly gray, turbinates moderately edematous with clear discharge, post-pharynx non erythematous. Neck: Supple without lymphadenopathy. Lungs: Clear to auscultation without wheezing, rhonchi or rales. {no increased work of breathing. CV: Normal S1, S2 without murmurs. Abdomen: Nondistended, nontender. Skin: erythematous patches with irregular border that is raised with somecentral scaling on her left posterior arm and popliteal fossa bilaterally. Extremities:  No clubbing, cyanosis or edema. Neuro:   Grossly intact.  Diagnositics/Labs:  Spirometry: FEV1: 2.59L  83%, FVC: 3.06L  84%, ratio consistent with non-obstructive   ACT score 14  Allergy testing: Environmental skin  prick testing positive for weeds, trees, dust mite  Food skin prick testing is negative for apple, strawberry and pineapple  Allergy testing results were read and interpreted by provider, documented by clinical staff.   Assessment and plan:   Allergic rhinoconjunctivitis    - Allergy testing was positive for weeds, trees, grasses, dust mite, molds, cat, cockroach    - For nasal congestion and drainage will have you trial Dysmista (provided with sample today).  Use 1 spray each nostril twice a day.  Hold your fluticasone nasal spray while you are on Dysmista    -  Continue Xyzal in PM and change Zyrtec to Allegra in the AM    - continue  Singulair 10 mg daily - take at bedtime    - Use Pataday 1 drop each eye as needed for itchy, watery, red eyes    - Discussed allergen immunotherapy as potential treatment option including benefits and risks and build-up protocol.  Packet provided.  Let us know if you would like to proceed with this option.   AuviQ epinephrine device demonstrated (if you do proceed with allergy injections will prescribe  this device)  Asthma, moderate persistent    - Have access to albuterol inhaler 2 puffs every 4-6 hours as needed for cough, wheeze, shortness of breath or chest tightness. Monitor frequency of use    - During your most symptomatic allergy seasons (spring and fall) use Qvar Redihaler 2 puffs twice a day.      - Continue Singulair as above  Asthma control goals:   Full participation in all desired activities (may need albuterol before activity)  Albuterol use two time or less a week on average (not counting use with activity)  Cough interfering with sleep two time or less a month  Oral steroids no more than once a year  No hospitalizations Let us know if you are not meeting the above goals  Rash  - Rash has some features of eczema and some features of possible psoriasis especially with the scale and raised borders.   - We'll treat with triamcinolone 0.5% ointment.  Apply thin layer to affected areas twice a day until improved. If not improved triamcinolone recommend she see dermatology for further evaluation of this rash  - Moisturization of the areas at least 1-2 times a day with emollients like Aquaphor, Eucerin, CeraVe, Vaseline.    Pollen food allergy syndrome     - Continue avoidance of fresh apple, strawberry, kiwi, pineapple, cherry due to oral symptoms following ingestion.   Skin testing to these fruits is negative for apple, strawberry and pineapple and that she does not have food allergy to these foods. It is rare that PFAS leads into life-threatening symptoms.   Follow-up in 4-6 months or sooner if needed  I appreciate the opportunity to take part in Westlyn's care. Please do not hesitate to contact me with questions.  Sincerely,   Margo Aye, MD Allergy/Immunology [facility]

## 2017-03-30 ENCOUNTER — Telehealth: Payer: Self-pay | Admitting: *Deleted

## 2017-03-30 MED ORDER — FLUTICASONE PROPIONATE HFA 110 MCG/ACT IN AERO: 2.0000 | INHALATION_SPRAY | Freq: Two times a day (BID) | RESPIRATORY_TRACT | 5 refills | Status: DC

## 2017-03-30 NOTE — Telephone Encounter (Signed)
Yes please change to Flovent 110 2 puffs bid

## 2017-03-30 NOTE — Telephone Encounter (Signed)
Flovent sent in patient advised of change verbalized understanding

## 2017-03-30 NOTE — Telephone Encounter (Signed)
Dr Delorse LekPadgett insurance does not cover Qvar Redihaler is it ok to switch patient to Flovent 110. Please advise

## 2017-04-01 ENCOUNTER — Encounter: Payer: Self-pay | Admitting: Adult Health

## 2017-04-01 ENCOUNTER — Ambulatory Visit (INDEPENDENT_AMBULATORY_CARE_PROVIDER_SITE_OTHER): Payer: BC Managed Care – PPO | Admitting: Adult Health

## 2017-04-01 VITALS — BP 128/80 | Temp 98.4°F | Ht 64.5 in | Wt 159.6 lb

## 2017-04-01 DIAGNOSIS — G5602 Carpal tunnel syndrome, left upper limb: Secondary | ICD-10-CM | POA: Diagnosis not present

## 2017-04-01 DIAGNOSIS — G43711 Chronic migraine without aura, intractable, with status migrainosus: Secondary | ICD-10-CM

## 2017-04-01 MED ORDER — DIVALPROEX SODIUM ER 500 MG PO TB24: 500.0000 mg | ORAL_TABLET | Freq: Every day | ORAL | 1 refills | Status: DC

## 2017-04-01 NOTE — Progress Notes (Signed)
Subjective:    Patient ID: Anyely Cunning, female    DOB: 06/10/1986, 31 y.o.   MRN: [id number]  HPI  31 year old female who  has a past medical history of Angio-edema; Asthma; Breast mass; GERD (gastroesophageal reflux disease); Migraines; and Wears glasses. She presents to the office today with two complaints.   1. She has been using Amitriptyline for migraines and it works well for her but she has had loss of libido since starting this medication. She reports that she stopped taking it and her sex drive came back but that her migraines returned.   2. Over the last month she has had pain in her left hand. The pain is described as " numbness and tingling". Pain is located in the first three digits and is worse at night. Paid radiates to forearm.     She denies any trauma. She does repetitive motion at work.    Review of Systems See HPI   Past Medical History:  Diagnosis Date  . Angio-edema   . Asthma   . Breast mass   . GERD (gastroesophageal reflux disease)   . Migraines   . Wears glasses     Social History   Social History  . Marital status: Married    Spouse name: N/A  . Number of children: N/A  . Years of education: N/A   Occupational History  . CMA [employer]   Social History Main Topics  . Smoking status: Never Smoker  . Smokeless tobacco: Never Used  . Alcohol use No  . Drug use: No  . Sexual activity: Yes    Birth control/ protection: IUD     Comment: 2014- placement Mirena IUD   Other Topics Concern  . Not on file   Social History Narrative  . No narrative on file    Past Surgical History:  Procedure Laterality Date  . APPENDECTOMY  April 2016  . LAPAROSCOPIC TUBAL LIGATION Bilateral 12/19/2015   Procedure: LAPAROSCOPIC BILATERAL TUBAL LIGATION;  Surgeon: Richardean Chimera, MD;  Location: [facility] [place];  Service: Gynecology;  Laterality: Bilateral;    Family History  Problem Relation Age of Onset  . Stroke Mother 18  . Hypertension  Mother   . Prostate cancer Father           . Diabetes Mellitus II Father   . Asthma Father   . Asthma Maternal Grandfather   . Allergic rhinitis Neg Hx   . Angioedema Neg Hx   . Atopy Neg Hx   . Eczema Neg Hx   . Immunodeficiency Neg Hx   . Urticaria Neg Hx     Allergies  Allergen Reactions  . Other Itching and Swelling    APPLES, PINEAPPLE, KIWI  Cause itchy throat and mouth swells  . Percocet [Oxycodone-Acetaminophen] Itching    Current Outpatient Prescriptions on File Prior to Visit  Medication Sig Dispense Refill  . albuterol (PROVENTIL HFA;VENTOLIN HFA) 108 (90 Base) MCG/ACT inhaler Inhale 2 puffs into the lungs every 6 (six) hours as needed for wheezing or shortness of breath. 1 Inhaler 5  . Azelastine-Fluticasone (DYMISTA) 137-50 MCG/ACT SUSP Place 1 spray into both nostrils 2 (two) times daily. 1 Bottle 5  . beclomethasone (QVAR REDIHALER) 80 MCG/ACT inhaler Inhale 2 puffs into the lungs 2 (two) times daily. 10.6 g 5  . cyclobenzaprine (FLEXERIL) 10 MG tablet Take 1 tablet (10 mg total) by mouth 3 (three) times daily as needed for muscle spasms. 30 tablet 0  .  fluticasone (FLONASE) 50 MCG/ACT nasal spray Place 2 sprays into both nostrils daily. 32 g 6  . fluticasone (FLOVENT HFA) 110 MCG/ACT inhaler Inhale 2 puffs into the lungs 2 (two) times daily. 1 Inhaler 5  . levocetirizine (XYZAL) 5 MG tablet Take 1 tablet (5 mg total) by mouth every evening. 90 tablet 1  . meclizine (ANTIVERT) 12.5 MG tablet Take 1 tablet (12.5 mg total) by mouth 3 (three) times daily as needed for dizziness. 30 tablet 0  . montelukast (SINGULAIR) 10 MG tablet Take 1 tablet (10 mg total) by mouth at bedtime. 30 tablet 5  . naproxen (NAPROSYN) 500 MG tablet Take 1 tablet (500 mg total) by mouth every 12 (twelve) hours as needed. 30 tablet 3  . Olopatadine HCl (PATADAY) 0.2 % SOLN Place 1 drop into both eyes daily. 1 Bottle 5  . promethazine (PHENERGAN) 25 MG tablet Take 1 tablet (25 mg total) by mouth  every 6 (six) hours as needed for nausea or vomiting. 30 tablet 0  . SUMAtriptan (IMITREX) 100 MG tablet Take 1 tab at earliest onset of headache.  May repeat once in 2 hours if headache persists or recurs. 10 tablet 2  . triamcinolone ointment (KENALOG) 0.5 % Apply 1 application topically 2 (two) times daily. 45 g 3   No current facility-administered medications on file prior to visit.     BP 128/80 (BP Location: Left Arm, Patient Position: Sitting, Cuff Size: Normal)   Temp 98.4 F (36.9 C) (Oral)   Ht 5' 4.5" (1.638 m)   Wt 159 lb 9.6 oz (72.4 kg)   BMI 26.97 kg/m       Objective:   Physical Exam  Constitutional: She is oriented to person, place, and time. She appears well-developed and well-nourished. No distress.  Eyes: Conjunctivae and EOM are normal. Pupils are equal, round, and reactive to light. Right eye exhibits no discharge. Left eye exhibits no discharge.  Cardiovascular: Normal rate, regular rhythm, normal heart sounds and intact distal pulses.  Exam reveals no gallop and no friction rub.   No murmur heard. Pulmonary/Chest: Effort normal and breath sounds normal. No respiratory distress. She has no wheezes. She has no rales. She exhibits no tenderness.  Neurological: She is alert and oriented to person, place, and time.  + Phalens (-) Tinels  Skin: Skin is warm and dry. No rash noted. She is not diaphoretic. No erythema. No pallor.  Psychiatric: She has a normal mood and affect. Her behavior is normal. Judgment and thought content normal.  Nursing note and vitals reviewed.     Assessment & Plan:  1. Intractable chronic migraine without aura and with status migrainosus - Will trial her on Depakote  - D/c Elavil  - divalproex (DEPAKOTE ER) 500 MG 24 hr tablet; Take 1 tablet (500 mg total) by mouth daily.  Dispense: 90 tablet; Refill: 1 - Hepatic function panel; Future - Follow up if no improvement   2. Carpal tunnel syndrome of left wrist - we discussed treatment  options including wrist splint, prednisone therapy, steroid injection and surgery. Sh choose to try wrist splint first - Follow up if no improvement in 2-3 months   Shirline Freesory Larkin Alfred, NP

## 2017-04-02 ENCOUNTER — Other Ambulatory Visit: Payer: Self-pay | Admitting: Adult Health

## 2017-04-02 DIAGNOSIS — G43019 Migraine without aura, intractable, without status migrainosus: Secondary | ICD-10-CM

## 2017-04-06 MED ORDER — PROMETHAZINE HCL 25 MG PO TABS: 25.0000 mg | ORAL_TABLET | Freq: Four times a day (QID) | ORAL | 1 refills | Status: DC | PRN

## 2017-04-07 ENCOUNTER — Encounter: Payer: Self-pay | Admitting: Cardiology

## 2017-04-07 ENCOUNTER — Ambulatory Visit (INDEPENDENT_AMBULATORY_CARE_PROVIDER_SITE_OTHER): Payer: BC Managed Care – PPO | Admitting: Cardiology

## 2017-04-07 VITALS — BP 104/80 | HR 96 | Ht 64.0 in | Wt 161.2 lb

## 2017-04-07 DIAGNOSIS — R002 Palpitations: Secondary | ICD-10-CM

## 2017-04-07 DIAGNOSIS — R55 Syncope and collapse: Secondary | ICD-10-CM

## 2017-04-07 NOTE — Patient Instructions (Signed)
No change with current treatment   Your physician recommends that you schedule a follow-up appointment in on an as needed basis

## 2017-04-07 NOTE — Progress Notes (Signed)
PCP: Shirline Frees, NP  Clinic Note: Chief Complaint  Patient presents with  . follow up    no chest pain , no shortness of breath, no swelling, "still have palp.but do not pay attention to them"    HPI: Samantha Hartman is a 31 y.o. female who is being seen today for follow-up evaluation of palpitations & CP at the request of Shirline Frees, NP.  Aariel Ems was first By Theodore Demark back in February 2028 for palpitations. She had an event monitor and an echocardiogram revealed normal. Because of persistent symptoms when I saw her in April, we ordered a GXT to ensure there was no exacerbation of palpitations with exercise and exclude cardiac chest pain.  Recent Hospitalizations: NONE  Studies Personally Reviewed - (if available, images/films reviewed: From Epic Chart or Care Everywhere)  Event monitor February 2018: Sinus rhythm with sinus tachycardia and rare PVCs/PACs. No arrhythmias. She was aware of sinus tachycardia and PVCs with dizziness.  2-D echo 12/02/2016: Essentially normal. EF 60-65% with no wall motion abnormalities. No mitral valve prolapse. Normal valves  GXT: Exercised almost 8 minutes. Complaint of chest pain with or without exercise. Normal blood pressure response and no EKG changes. Duke Treadmill Score +8. Low risk. Reached ~10 METS.    Interval History: Samantha Hartman presents today overall doing fairly well. She still has some intermittent palpitations, does not do his much text exam. Only one time last month she had a recent spell when she was driving and felt the symptoms of flushing.. She had no true near syncope or syncope Symptoms, just felt related and dizzy. This is not a very common occurrence for her over the last couple months however.   No chest pain or shortness of breath with rest or exertion. No PND, orthopnea or edema. No TIA/amaurosis fugax symptoms. No claudication.  ROS: A comprehensive was performed. Review of Systems  Constitutional: Negative  for malaise/fatigue.  HENT: Negative for congestion.   Respiratory: Negative for cough, shortness of breath and wheezing.   Neurological: Negative for focal weakness and loss of consciousness.  Endo/Heme/Allergies: Negative for environmental allergies.  Psychiatric/Behavioral: The patient is not nervous/anxious.    I have reviewed and (if needed) personally updated the patient's problem list, medications, allergies, past medical and surgical history, social and family history.   Past Medical History:  Diagnosis Date  . Angio-edema   . Asthma   . Breast mass   . GERD (gastroesophageal reflux disease)   . Migraines   . Wears glasses     Past Surgical History:  Procedure Laterality Date  . APPENDECTOMY  April 2016  . LAPAROSCOPIC TUBAL LIGATION Bilateral 12/19/2015   Procedure: LAPAROSCOPIC BILATERAL TUBAL LIGATION;  Surgeon: Richardean Chimera, MD;  Location: [facility] [place];  Service: Gynecology;  Laterality: Bilateral;    Current Meds  Medication Sig  . albuterol (PROVENTIL HFA;VENTOLIN HFA) 108 (90 Base) MCG/ACT inhaler Inhale 2 puffs into the lungs every 6 (six) hours as needed for wheezing or shortness of breath.  . Azelastine-Fluticasone (DYMISTA) 137-50 MCG/ACT SUSP Place 1 spray into both nostrils 2 (two) times daily.  . cyclobenzaprine (FLEXERIL) 10 MG tablet Take 1 tablet (10 mg total) by mouth 3 (three) times daily as needed for muscle spasms.  . divalproex (DEPAKOTE ER) 500 MG 24 hr tablet Take 1 tablet (500 mg total) by mouth daily.  . fluticasone (FLOVENT HFA) 110 MCG/ACT inhaler Inhale 2 puffs into the lungs 2 (two) times daily.  Marland Kitchen levocetirizine (XYZAL) 5  MG tablet Take 1 tablet (5 mg total) by mouth every evening.  . montelukast (SINGULAIR) 10 MG tablet Take 1 tablet (10 mg total) by mouth at bedtime.  . naproxen (NAPROSYN) 500 MG tablet Take 1 tablet (500 mg total) by mouth every 12 (twelve) hours as needed.  . Olopatadine HCl (PATADAY) 0.2 % SOLN Place 1 drop  into both eyes daily.  . SUMAtriptan (IMITREX) 100 MG tablet Take 1 tab at earliest onset of headache.  May repeat once in 2 hours if headache persists or recurs.  . triamcinolone ointment (KENALOG) 0.5 % Apply 1 application topically 2 (two) times daily.    Allergies  Allergen Reactions  . Other Itching and Swelling    APPLES, PINEAPPLE, KIWI  Cause itchy throat and mouth swells  . Percocet [Oxycodone-Acetaminophen] Itching    Social History   Social History  . Marital status: Married    Spouse name: N/A  . Number of children: N/A  . Years of education: N/A   Occupational History  . CMA [employer]   Social History Main Topics  . Smoking status: Never Smoker  . Smokeless tobacco: Never Used  . Alcohol use No  . Drug use: No  . Sexual activity: Yes    Birth control/ protection: IUD     Comment: 2014- placement Mirena IUD   Other Topics Concern  . None   Social History Narrative  . None    family history includes Asthma in her father and maternal grandfather; Diabetes Mellitus II in her father; Hypertension in her mother; Prostate cancer in her father; Stroke (age of onset: 2846) in her mother.  Wt Readings from Last 3 Encounters:  04/07/17 161 lb 3.2 oz (73.1 kg)  04/01/17 159 lb 9.6 oz (72.4 kg)  03/17/17 160 lb 12.8 oz (72.9 kg)    PHYSICAL EXAM BP 104/80 (BP Location: Left Arm, Patient Position: Sitting, Cuff Size: Normal)   Pulse 96   Ht 5\' 4"  (1.626 m)   Wt 161 lb 3.2 oz (73.1 kg)   BMI 27.67 kg/m  General appearance: Well-nourished and well-groomed. Healthy-appearing woman in no acute distress. Lungs: clear to auscultation bilaterally, normal percussion bilaterally and non-labored Heart: regular rate and rhythm, S1 &S2 normal, no murmur, click, rub or gallop; nondisplaced PMI Abdomen: soft, non-tender; bowel sounds normal; no masses,  no organomegaly; no HJR Neurologic: Mental status: Alert & oriented x 3, thought content appropriate; non-focal exam.   Pleasant mood & affect.    Adult ECG Report N/A  Other studies Reviewed: Additional studies/ records that were reviewed today include:  Recent Labs:  Lab Results  Component Value Date   CREATININE 0.68 11/19/2016   BUN 6 11/19/2016   NA 139 11/19/2016   K 3.9 11/19/2016   CL 105 11/19/2016   CO2 28 11/19/2016    ASSESSMENT / PLAN: Problem List Items Addressed This Visit    Palpitations (Chronic)    No real arrhythmias noted on monitor. Very rare PACs and PVCs noted. She was essentially asymptomatic with her PVCs and sinus tachycardia with dizziness. With her resting heart rate of 96 it would be nice to use a calcium channel blocker or beta blocker, however her blood pressure is relatively low on no medications. This would preclude me using a medication.  No exacerbation of PACs or PVCs on GXT - likely benign.  Encourage adequate hydration & liberalize salt intake.      Syncope and collapse - Primary    Other episodes of  passing out. Original episode sounds more consistent with neurocardiogenic syncope. No arrhythmias noted on event monitor. Echo was normal. GXT was low risk.  Plan for now basically monitor. Encourage adequate hydration. If symptoms recur, would consider loop recorder.         Current medicines are reviewed at length with the patient today. (+/- concerns) N/A The following changes have been made: N/A  Patient Instructions  No change with current treatment   Your physician recommends that you schedule a follow-up appointment in on an as needed basis    Studies Ordered:   No orders of the defined types were placed in this encounter.     Bryan Lemma, M.D., M.S. Interventional Cardiologist   Pager # [phone] Phone # [phone] [address] [place], [place] [address]

## 2017-04-09 NOTE — Assessment & Plan Note (Addendum)
No real arrhythmias noted on monitor. Very rare PACs and PVCs noted. She was essentially asymptomatic with her PVCs and sinus tachycardia with dizziness. With her resting heart rate of 96 it would be nice to use a calcium channel blocker or beta blocker, however her blood pressure is relatively low on no medications. This would preclude me using a medication.  No exacerbation of PACs or PVCs on GXT - likely benign.  Encourage adequate hydration & liberalize salt intake.

## 2017-04-09 NOTE — Assessment & Plan Note (Signed)
Other episodes of passing out. Original episode sounds more consistent with neurocardiogenic syncope. No arrhythmias noted on event monitor. Echo was normal. GXT was low risk.  Plan for now basically monitor. Encourage adequate hydration. If symptoms recur, would consider loop recorder.

## 2017-05-11 ENCOUNTER — Ambulatory Visit (INDEPENDENT_AMBULATORY_CARE_PROVIDER_SITE_OTHER): Payer: BC Managed Care – PPO | Admitting: Adult Health

## 2017-05-11 ENCOUNTER — Encounter: Payer: Self-pay | Admitting: Adult Health

## 2017-05-11 VITALS — BP 100/74 | Temp 98.4°F | Wt 166.0 lb

## 2017-05-11 DIAGNOSIS — M7989 Other specified soft tissue disorders: Secondary | ICD-10-CM

## 2017-05-11 DIAGNOSIS — G43711 Chronic migraine without aura, intractable, with status migrainosus: Secondary | ICD-10-CM | POA: Diagnosis not present

## 2017-05-11 MED ORDER — DIVALPROEX SODIUM ER 250 MG PO TB24: 250.0000 mg | ORAL_TABLET | Freq: Every day | ORAL | 1 refills | Status: DC

## 2017-05-11 NOTE — Progress Notes (Signed)
Subjective:    Patient ID: Samantha Hartman, female    DOB: Aug 05, 1986, 31 y.o.   MRN: [id number]  HPI  31 year old female who  has a past medical history of Angio-edema; Asthma; Breast mass; GERD (gastroesophageal reflux disease); Migraines; and Wears glasses. She presents tot he office today for the complaint of swelling in bilateral lower extremities. She reports that this has been an ongoing issues for about the last 3 weeks. Swelling in her ankles and feet become worse during the day.   Her symptoms started soon after starting Depakote for migraines. She feels as though Depakote is working well for her migraines and does not want to come off of it.   Her diet has been poor but she is drinking plenty of water throughout the day  Review of Systems  Constitutional: Negative.   Respiratory: Negative.   Cardiovascular: Positive for leg swelling.  Genitourinary: Negative.   All other systems reviewed and are negative.  Past Medical History:  Diagnosis Date  . Angio-edema   . Asthma   . Breast mass   . GERD (gastroesophageal reflux disease)   . Migraines   . Wears glasses     Social History   Social History  . Marital status: Married    Spouse name: N/A  . Number of children: N/A  . Years of education: N/A   Occupational History  . CMA [employer]   Social History Main Topics  . Smoking status: Never Smoker  . Smokeless tobacco: Never Used  . Alcohol use No  . Drug use: No  . Sexual activity: Yes    Birth control/ protection: IUD     Comment: 2014- placement Mirena IUD   Other Topics Concern  . Not on file   Social History Narrative  . No narrative on file    Past Surgical History:  Procedure Laterality Date  . APPENDECTOMY  April 2016  . LAPAROSCOPIC TUBAL LIGATION Bilateral 12/19/2015   Procedure: LAPAROSCOPIC BILATERAL TUBAL LIGATION;  Surgeon: Richardean ChimeraJohn McComb, MD;  [facility] [place];  Service: Gynecology;  Laterality: Bilateral;    Family  History  Problem Relation Age of Onset  . Stroke Mother [id number]  . Hypertension Mother   . Prostate cancer Father           . Diabetes Mellitus II Father   . Asthma Father   . Asthma Maternal Grandfather   . Allergic rhinitis Neg Hx   . Angioedema Neg Hx   . Atopy Neg Hx   . Eczema Neg Hx   . Immunodeficiency Neg Hx   . Urticaria Neg Hx     Allergies  Allergen Reactions  . Other Itching and Swelling    APPLES, PINEAPPLE, KIWI  Cause itchy throat and mouth swells  . Percocet [Oxycodone-Acetaminophen] Itching    Current Outpatient Prescriptions on File Prior to Visit  Medication Sig Dispense Refill  . albuterol (PROVENTIL HFA;VENTOLIN HFA) 108 (90 Base) MCG/ACT inhaler Inhale 2 puffs into the lungs every 6 (six) hours as needed for wheezing or shortness of breath. 1 Inhaler 5  . Azelastine-Fluticasone (DYMISTA) 137-50 MCG/ACT SUSP Place 1 spray into both nostrils 2 (two) times daily. 1 Bottle 5  . cyclobenzaprine (FLEXERIL) 10 MG tablet Take 1 tablet (10 mg total) by mouth 3 (three) times daily as needed for muscle spasms. 30 tablet 0  . fluticasone (FLOVENT HFA) 110 MCG/ACT inhaler Inhale 2 puffs into the lungs 2 (two) times daily. 1 Inhaler 5  .  levocetirizine (XYZAL) 5 MG tablet Take 1 tablet (5 mg total) by mouth every evening. 90 tablet 1  . montelukast (SINGULAIR) 10 MG tablet Take 1 tablet (10 mg total) by mouth at bedtime. 30 tablet 5  . naproxen (NAPROSYN) 500 MG tablet Take 1 tablet (500 mg total) by mouth every 12 (twelve) hours as needed. 30 tablet 3  . Olopatadine HCl (PATADAY) 0.2 % SOLN Place 1 drop into both eyes daily. 1 Bottle 5  . SUMAtriptan (IMITREX) 100 MG tablet Take 1 tab at earliest onset of headache.  May repeat once in 2 hours if headache persists or recurs. 10 tablet 2  . triamcinolone ointment (KENALOG) 0.5 % Apply 1 application topically 2 (two) times daily. 45 g 3   No current facility-administered medications on file prior to visit.     BP 100/74 (BP  Location: Left Arm)   Temp 98.4 F (36.9 C)   Wt 166 lb (75.3 kg)   BMI 28.49 kg/m       Objective:   Physical Exam  Constitutional: She is oriented to person, place, and time. She appears well-developed and well-nourished. No distress.  Cardiovascular: Normal rate, regular rhythm, normal heart sounds and intact distal pulses.  Exam reveals no gallop and no friction rub.   No murmur heard. Pulmonary/Chest: Effort normal and breath sounds normal. No respiratory distress. She has no wheezes. She has no rales. She exhibits no tenderness.  Musculoskeletal:  No swelling in bilateral lower extremities today   Neurological: She is alert and oriented to person, place, and time.  Skin: Skin is warm and dry. No rash noted. No erythema.  Psychiatric: She has a normal mood and affect. Her behavior is normal. Thought content normal.  Nursing note and vitals reviewed.     Assessment & Plan:  1. Localized swelling of lower extremity - Likely due to depakote. Will decrease dose and she how she responds. Advised compression socks and to work on a healthier diet. She will follow up with me via Mychart in 1-2 weeks  - divalproex (DEPAKOTE ER) 250 MG 24 hr tablet; Take 1 tablet (250 mg total) by mouth daily.  Dispense: 30 tablet; Refill: 1  2. Intractable chronic migraine without aura and with status migrainosus  - divalproex (DEPAKOTE ER) 250 MG 24 hr tablet; Take 1 tablet (250 mg total) by mouth daily.  Dispense: 30 tablet; Refill: 1  Shirline Freesory Kelsea Mousel, NP

## 2017-07-02 ENCOUNTER — Encounter: Payer: Self-pay | Admitting: Adult Health

## 2017-08-17 ENCOUNTER — Other Ambulatory Visit: Payer: Self-pay | Admitting: Adult Health

## 2017-08-17 DIAGNOSIS — M5412 Radiculopathy, cervical region: Secondary | ICD-10-CM

## 2017-08-18 ENCOUNTER — Other Ambulatory Visit: Payer: Self-pay | Admitting: Adult Health

## 2017-08-18 DIAGNOSIS — M5412 Radiculopathy, cervical region: Secondary | ICD-10-CM

## 2017-08-18 MED ORDER — CYCLOBENZAPRINE HCL 10 MG PO TABS: 10.0000 mg | ORAL_TABLET | Freq: Three times a day (TID) | ORAL | 0 refills | Status: DC | PRN

## 2017-09-03 ENCOUNTER — Encounter: Payer: Self-pay | Admitting: Adult Health

## 2017-09-07 MED ORDER — SUMATRIPTAN SUCCINATE 100 MG PO TABS: ORAL_TABLET | ORAL | 0 refills | Status: DC

## 2017-09-17 ENCOUNTER — Ambulatory Visit: Payer: Self-pay | Admitting: *Deleted

## 2017-09-17 NOTE — Telephone Encounter (Signed)
See triage notes.    She has agreed to go to the urgent care since we were not able to get an appt for her for today. I instructed her to call back if that did not work out for her. She verbalized understanding.  Reason for Disposition . [1] SEVERE headache (e.g., excruciating) AND [2] not improved after 2 hours of pain medicine  Answer Assessment - Initial Assessment Questions 1. LOCATION: "Where does it hurt?"      It's on the left side of my head. 2. ONSET: "When did the headache start?" (Minutes, hours or days)      It usually starts in the left side.  I have chronic migraines.   Depakote was making it better but it's not working anymore.   Had this HA for 4 days.  I've had them last this long before. 3. PATTERN: "Does the pain come and go, or has it been constant since it started?"     Pain is constant in the left side of my head. 4. SEVERITY: "How bad is the pain?" and "What does it keep you from doing?"  (e.g., Scale 1-10; mild, moderate, or severe)   - MILD (1-3): doesn't interfere with normal activities    - MODERATE (4-7): interferes with normal activities or awakens from sleep    - SEVERE (8-10): excruciating pain, unable to do any normal activities        8 5. RECURRENT SYMPTOM: "Have you ever had headaches before?" If so, ask: "When was the last time?" and "What happened that time?"      Yes.  I have chronic HA. 6. CAUSE: "What do you think is causing the headache?"     Migraine 7. MIGRAINE: "Have you been diagnosed with migraine headaches?" If so, ask: "Is this headache similar?"      Yes.  This is the same as my usual HA 8. HEAD INJURY: "Has there been any recent injury to the head?"      No 9. OTHER SYMPTOMS: "Do you have any other symptoms?" (fever, stiff neck, eye pain, sore throat, cold symptoms)     Nausea.   10. PREGNANCY: "Is there any chance you are pregnant?" "When was your last menstrual period?"       No Last period Nov. 8th.  Protocols used:  HEADACHE-A-AH

## 2017-11-26 ENCOUNTER — Ambulatory Visit (INDEPENDENT_AMBULATORY_CARE_PROVIDER_SITE_OTHER): Payer: BC Managed Care – PPO | Admitting: Adult Health

## 2017-11-26 ENCOUNTER — Encounter: Payer: Self-pay | Admitting: Adult Health

## 2017-11-26 VITALS — BP 120/88 | Temp 98.4°F | Wt 173.0 lb

## 2017-11-26 DIAGNOSIS — Z0000 Encounter for general adult medical examination without abnormal findings: Secondary | ICD-10-CM | POA: Diagnosis not present

## 2017-11-26 DIAGNOSIS — F321 Major depressive disorder, single episode, moderate: Secondary | ICD-10-CM | POA: Diagnosis not present

## 2017-11-26 DIAGNOSIS — G43711 Chronic migraine without aura, intractable, with status migrainosus: Secondary | ICD-10-CM | POA: Diagnosis not present

## 2017-11-26 LAB — BASIC METABOLIC PANEL
BUN: 11 mg/dL (ref 6–23)
CO2: 28 mEq/L (ref 19–32)
CREATININE: 0.67 mg/dL (ref 0.40–1.20)
Calcium: 9.2 mg/dL (ref 8.4–10.5)
Chloride: 102 mEq/L (ref 96–112)
GFR: 109.42 mL/min (ref >60.00)
Glucose, Bld: 102 mg/dL — ABNORMAL HIGH (ref 70–99)
Potassium: 4.3 mEq/L (ref 3.5–5.1)
Sodium: 138 mEq/L (ref 135–145)

## 2017-11-26 LAB — CBC WITH DIFFERENTIAL/PLATELET
BASOS ABS: 0 10*3/uL (ref 0.0–0.1)
Basophils Relative: 0.3 % (ref 0.0–3.0)
EOS ABS: 0.2 10*3/uL (ref 0.0–0.7)
Eosinophils Relative: 2.5 % (ref 0.0–5.0)
HCT: 39.3 % (ref 36.0–46.0)
HEMOGLOBIN: 13.3 g/dL (ref 12.0–15.0)
Lymphocytes Relative: 23.1 % (ref 12.0–46.0)
Lymphs Abs: 1.7 10*3/uL (ref 0.7–4.0)
MCHC: 33.9 g/dL (ref 30.0–36.0)
MCV: 91.1 fl (ref 78.0–100.0)
MONO ABS: 0.5 10*3/uL (ref 0.1–1.0)
Monocytes Relative: 7.3 % (ref 3.0–12.0)
Neutro Abs: 4.9 10*3/uL (ref 1.4–7.7)
Neutrophils Relative %: 66.8 % (ref 43.0–77.0)
Platelets: 197 10*3/uL (ref 150.0–400.0)
RBC: 4.32 Mil/uL (ref 3.87–5.11)
RDW: 13.5 % (ref 11.5–15.5)
WBC: 7.3 10*3/uL (ref 4.0–10.5)

## 2017-11-26 LAB — HEPATIC FUNCTION PANEL
ALK PHOS: 71 U/L (ref 39–117)
ALT: 10 U/L (ref 0–35)
AST: 10 U/L (ref 0–37)
Albumin: 4.5 g/dL (ref 3.5–5.2)
BILIRUBIN DIRECT: 0.1 mg/dL (ref 0.0–0.3)
BILIRUBIN TOTAL: 0.4 mg/dL (ref 0.2–1.2)
Total Protein: 7.3 g/dL (ref 6.0–8.3)

## 2017-11-26 LAB — TSH: TSH: 1.77 u[IU]/mL (ref 0.35–4.50)

## 2017-11-26 LAB — LIPID PANEL
CHOL/HDL RATIO: 3
Cholesterol: 176 mg/dL (ref 0–200)
HDL: 52.7 mg/dL (ref >39.00)
LDL CALC: 110 mg/dL — AB (ref 0–99)
NONHDL: 123.39
TRIGLYCERIDES: 69 mg/dL (ref 0.0–149.0)
VLDL: 13.8 mg/dL (ref 0.0–40.0)
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
## 2017-11-30 ENCOUNTER — Encounter: Payer: Self-pay | Admitting: Adult Health

## 2017-12-01 ENCOUNTER — Other Ambulatory Visit: Payer: Self-pay | Admitting: Adult Health

## 2017-12-01 MED ORDER — CITALOPRAM HYDROBROMIDE 10 MG PO TABS: 10.0000 mg | ORAL_TABLET | Freq: Every day | ORAL | 0 refills | Status: DC

## 2018-02-13 ENCOUNTER — Other Ambulatory Visit: Payer: Self-pay | Admitting: Adult Health

## 2018-02-15 MED ORDER — SUMATRIPTAN SUCCINATE 100 MG PO TABS: ORAL_TABLET | ORAL | 8 refills | Status: DC

## 2018-02-15 NOTE — Telephone Encounter (Signed)
Sent to the pharmacy by e-scribe. 

## 2018-03-15 ENCOUNTER — Encounter: Payer: Self-pay | Admitting: Adult Health

## 2018-03-15 MED ORDER — LEVOCETIRIZINE DIHYDROCHLORIDE 5 MG PO TABS: 5.0000 mg | ORAL_TABLET | Freq: Every evening | ORAL | 2 refills | Status: DC

## 2018-04-15 ENCOUNTER — Other Ambulatory Visit: Payer: Self-pay | Admitting: Obstetrics and Gynecology

## 2018-04-15 ENCOUNTER — Other Ambulatory Visit: Payer: Self-pay

## 2018-04-15 ENCOUNTER — Encounter: Payer: Self-pay | Admitting: Adult Health

## 2018-04-15 DIAGNOSIS — N6310 Unspecified lump in the right breast, unspecified quadrant: Secondary | ICD-10-CM

## 2018-04-15 DIAGNOSIS — J4540 Moderate persistent asthma, uncomplicated: Secondary | ICD-10-CM

## 2018-04-15 DIAGNOSIS — J301 Allergic rhinitis due to pollen: Secondary | ICD-10-CM

## 2018-04-15 MED ORDER — MONTELUKAST SODIUM 10 MG PO TABS: 10.0000 mg | ORAL_TABLET | Freq: Every day | ORAL | 5 refills | Status: DC

## 2018-04-19 ENCOUNTER — Ambulatory Visit
Admission: RE | Admit: 2018-04-19 | Discharge: 2018-04-19 | Disposition: A | Discharge status: Home / Self Care | Payer: BC Managed Care – PPO | Source: Ambulatory Visit | Attending: Obstetrics and Gynecology | Admitting: Obstetrics and Gynecology

## 2018-04-19 DIAGNOSIS — N6310 Unspecified lump in the right breast, unspecified quadrant: Secondary | ICD-10-CM

## 2018-05-08 IMAGING — DX DG SHOULDER 2+V*L*
3 series · 3 of 3 positions shown · non-contrast
Comparison: None.

CLINICAL DATA: Posterior shoulder pain

EXAM:
LEFT SHOULDER - 2+ VIEW

[grashey]
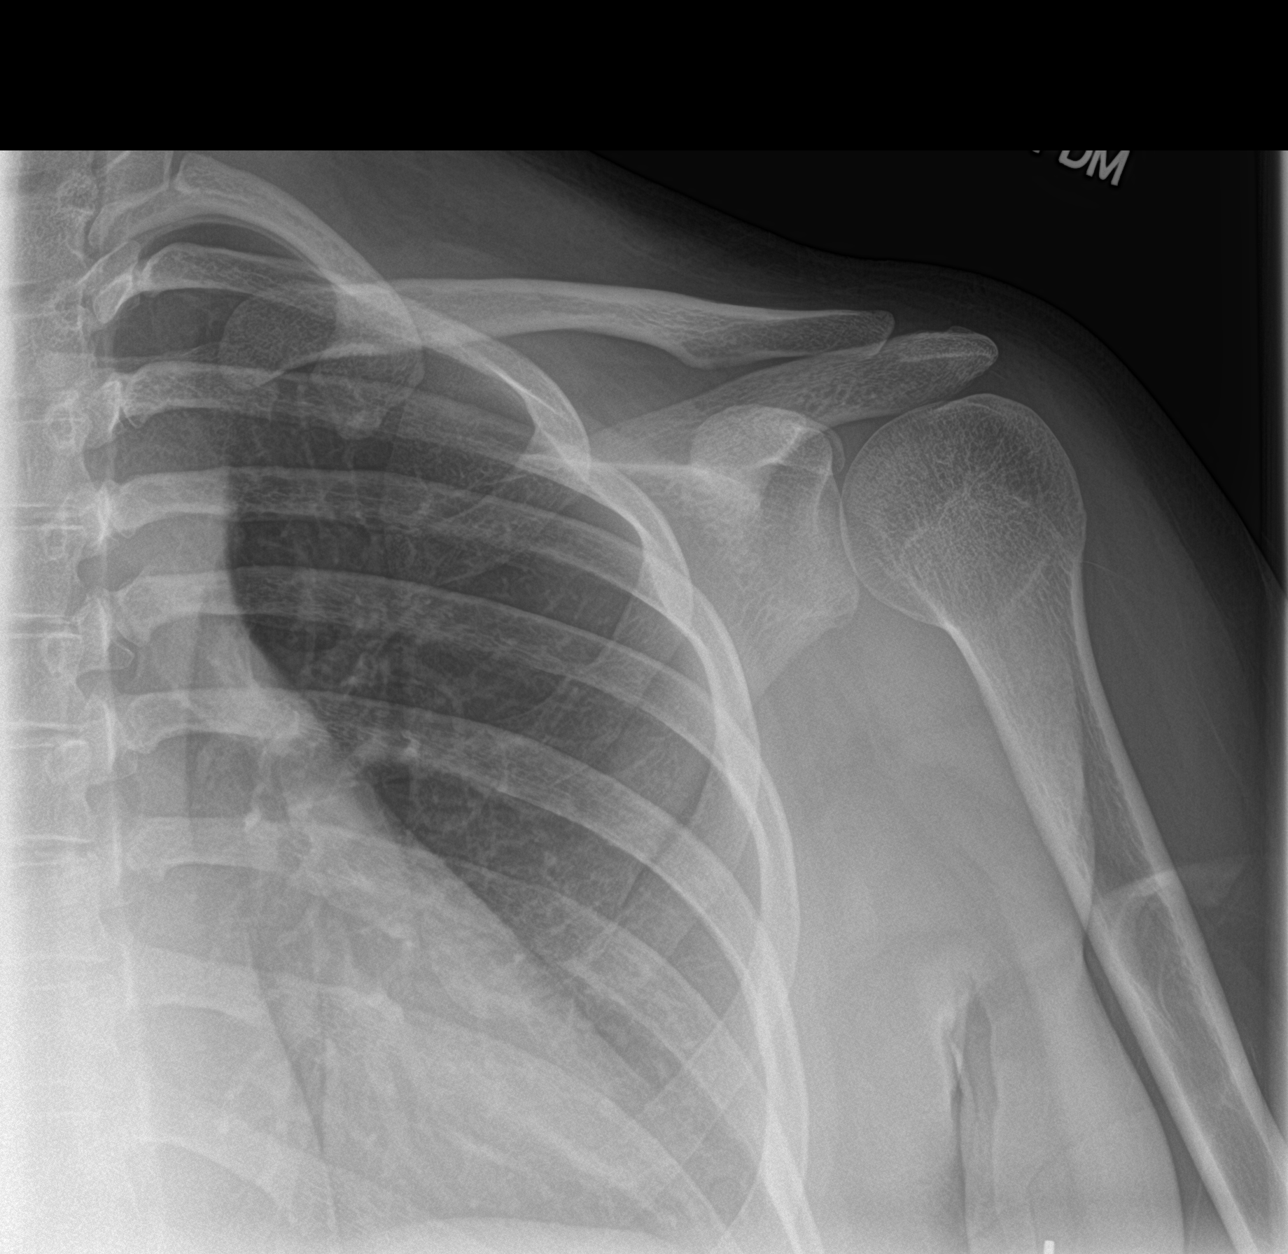

[y view]
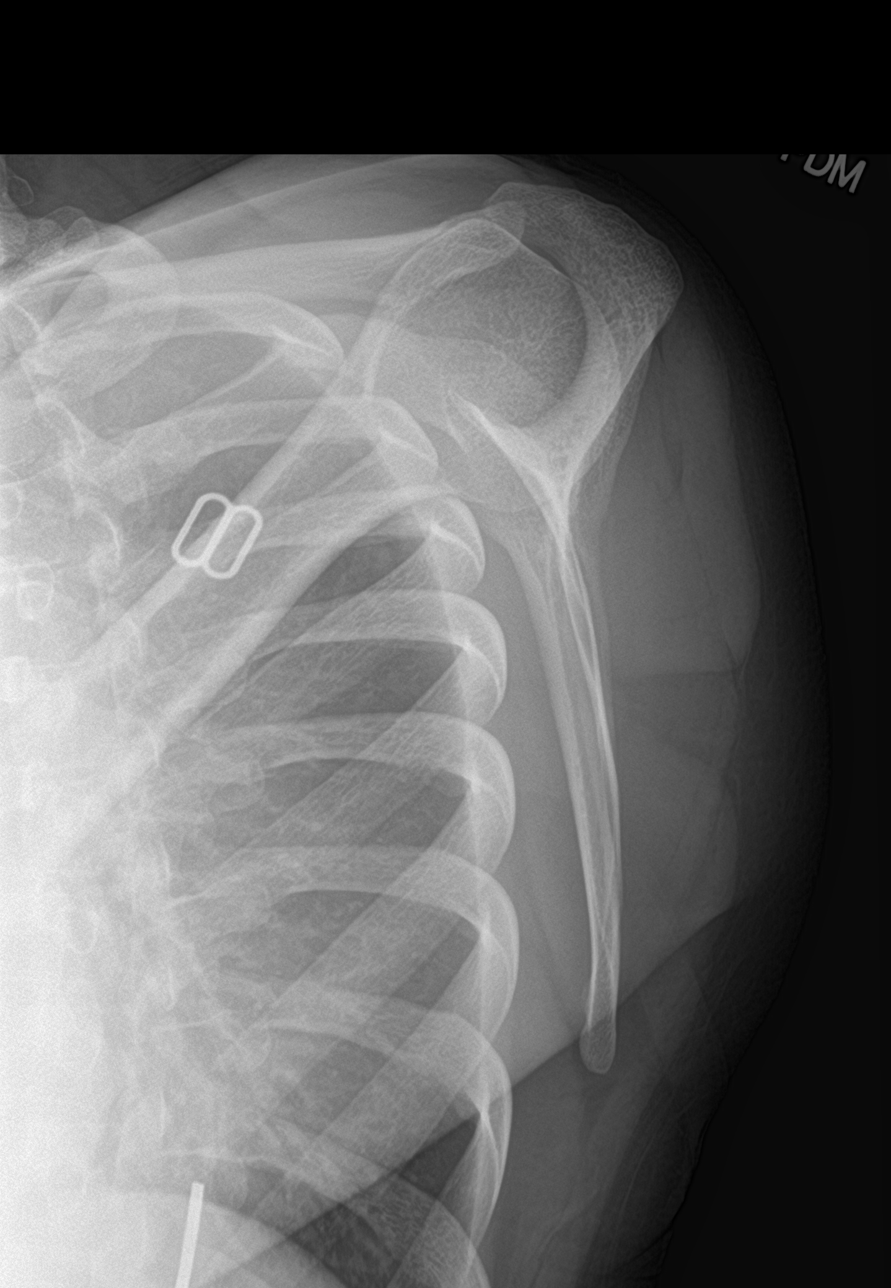

[shoulder axial]
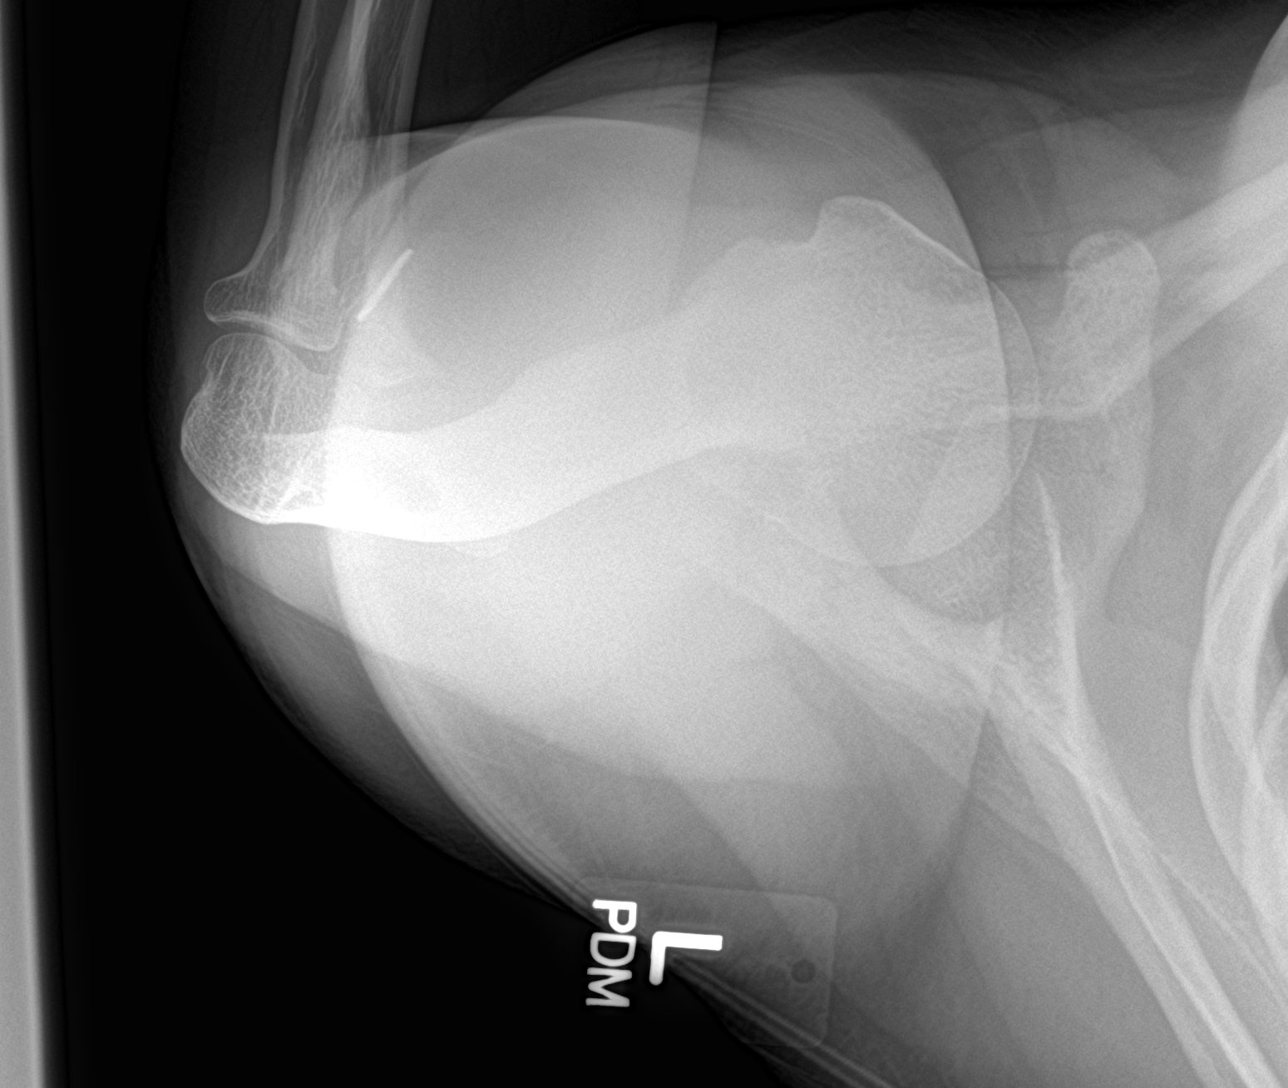

[3 of 3 positions shown; findings below may reference images not displayed]

FINDINGS: There is no evidence of fracture or dislocation. There is no
evidence of arthropathy or other focal bone abnormality. Soft
tissues are unremarkable.
IMPRESSION: Negative.

## 2018-06-09 ENCOUNTER — Other Ambulatory Visit: Payer: Self-pay | Admitting: Family

## 2018-08-06 ENCOUNTER — Encounter: Payer: Self-pay | Admitting: Adult Health

## 2018-08-09 ENCOUNTER — Encounter: Payer: Self-pay | Admitting: Adult Health

## 2018-08-09 ENCOUNTER — Encounter: Payer: Self-pay | Admitting: Family Medicine

## 2018-08-09 ENCOUNTER — Ambulatory Visit (INDEPENDENT_AMBULATORY_CARE_PROVIDER_SITE_OTHER): Payer: BC Managed Care – PPO | Admitting: Adult Health

## 2018-08-09 VITALS — BP 110/80 | Temp 98.7°F | Wt 171.0 lb

## 2018-08-09 DIAGNOSIS — G43711 Chronic migraine without aura, intractable, with status migrainosus: Secondary | ICD-10-CM | POA: Diagnosis not present

## 2018-08-09 DIAGNOSIS — Z760 Encounter for issue of repeat prescription: Secondary | ICD-10-CM | POA: Diagnosis not present

## 2018-08-09 MED ORDER — TRIAMCINOLONE ACETONIDE 0.5 % EX OINT: 1.0000 "application " | TOPICAL_OINTMENT | Freq: Two times a day (BID) | CUTANEOUS | 3 refills | Status: DC

## 2018-08-09 MED ORDER — DIVALPROEX SODIUM ER 250 MG PO TB24: 250.0000 mg | ORAL_TABLET | Freq: Every day | ORAL | 1 refills | Status: DC

## 2018-08-09 MED ORDER — KETOROLAC TROMETHAMINE 60 MG/2ML IM SOLN
60.0000 mg | Freq: Once | INTRAMUSCULAR | Status: AC
  Administered 2018-08-09: 60 mg via INTRAMUSCULAR

## 2018-08-09 NOTE — Progress Notes (Signed)
Subjective:    Patient ID: Samantha Hartman, female    DOB: 09-29-1986, 32 y.o.   MRN: [id number]  HPI  32 year old female who  has a past medical history of Angio-edema, Asthma, Breast mass, GERD (gastroesophageal reflux disease), Migraines, and Wears glasses.  She presents to the office today for an acute on chronic issue of migraine headache. She has been out of Depakote and reports having a migraine for 2 weeks intermittently. Typical migraine for her. Is well controlled with Depakote.   Denies nausea or vomiting.   Review of Systems See HPI   Past Medical History:  Diagnosis Date  . Angio-edema   . Asthma   . Breast mass   . GERD (gastroesophageal reflux disease)   . Migraines   . Wears glasses     Social History   Socioeconomic History  . Marital status: Married    Spouse name: Not on file  . Number of children: Not on file  . Years of education: Not on file  . Highest education level: Not on file  Occupational History  . Occupation: CMA    Employer: [employer]  Social Needs  . Financial resource strain: Not on file  . Food insecurity:    Worry: Not on file    Inability: Not on file  . Transportation needs:    Medical: Not on file    Non-medical: Not on file  Tobacco Use  . Smoking status: Never Smoker  . Smokeless tobacco: Never Used  Substance and Sexual Activity  . Alcohol use: No    Alcohol/week: 0.0 standard drinks  . Drug use: No  . Sexual activity: Yes    Birth control/protection: IUD    Comment: 2014- placement Mirena IUD  Lifestyle  . Physical activity:    Days per week: Not on file    Minutes per session: Not on file  . Stress: Not on file  Relationships  . Social connections:    Talks on phone: Not on file    Gets together: Not on file    Attends religious service: Not on file    Active member of club or organization: Not on file    Attends meetings of clubs or organizations: Not on file    Relationship status: Not on file  .  Intimate partner violence:    Fear of current or ex partner: Not on file    Emotionally abused: Not on file    Physically abused: Not on file    Forced sexual activity: Not on file  Other Topics Concern  . Not on file  Social History Narrative  . Not on file    Past Surgical History:  Procedure Laterality Date  . APPENDECTOMY  April 2016  . LAPAROSCOPIC TUBAL LIGATION Bilateral 12/19/2015   Procedure: LAPAROSCOPIC BILATERAL TUBAL LIGATION;  Surgeon: Richardean Chimera, MD;  Location: [facility] [place];  Service: Gynecology;  Laterality: Bilateral;    Family History  Problem Relation Age of Onset  . Stroke Mother 74  . Hypertension Mother   . Prostate cancer Father           . Diabetes Mellitus II Father   . Asthma Father   . Asthma Maternal Grandfather   . Breast cancer Maternal Aunt   . Allergic rhinitis Neg Hx   . Angioedema Neg Hx   . Atopy Neg Hx   . Eczema Neg Hx   . Immunodeficiency Neg Hx   . Urticaria Neg Hx  Allergies  Allergen Reactions  . Other Itching and Swelling    APPLES, PINEAPPLE, KIWI  Cause itchy throat and mouth swells  . Percocet [Oxycodone-Acetaminophen] Itching    Current Outpatient Medications on File Prior to Visit  Medication Sig Dispense Refill  . albuterol (PROVENTIL HFA;VENTOLIN HFA) 108 (90 Base) MCG/ACT inhaler Inhale 2 puffs into the lungs every 6 (six) hours as needed for wheezing or shortness of breath. 1 Inhaler 5  . Azelastine-Fluticasone (DYMISTA) 137-50 MCG/ACT SUSP Place 1 spray into both nostrils 2 (two) times daily. 1 Bottle 5  . cyclobenzaprine (FLEXERIL) 10 MG tablet Take 1 tablet (10 mg total) 3 (three) times daily as needed by mouth for muscle spasms. 30 tablet 0  . fluticasone (FLOVENT HFA) 110 MCG/ACT inhaler Inhale 2 puffs into the lungs 2 (two) times daily. 1 Inhaler 5  . levocetirizine (XYZAL) 5 MG tablet Take 1 tablet (5 mg total) by mouth every evening. 90 tablet 2  . montelukast (SINGULAIR) 10 MG tablet Take 1  tablet (10 mg total) by mouth at bedtime. 30 tablet 5  . naproxen (NAPROSYN) 500 MG tablet Take 1 tablet (500 mg total) by mouth every 12 (twelve) hours as needed. 30 tablet 3  . Olopatadine HCl (PATADAY) 0.2 % SOLN Place 1 drop into both eyes daily. 1 Bottle 5  . SUMAtriptan (IMITREX) 100 MG tablet Take 1 tab at earliest onset of headache.  May repeat once in 2 hours if headache persists or recurs. 10 tablet 8   No current facility-administered medications on file prior to visit.     BP 110/80   Temp 98.7 F (37.1 C)   Wt 171 lb (77.6 kg)   BMI 29.35 kg/m       Objective:   Physical Exam  Constitutional: She is oriented to person, place, and time. She appears well-developed and well-nourished.  Eyes: Pupils are equal, round, and reactive to light. Conjunctivae and EOM are normal.  Cardiovascular: Normal rate, regular rhythm, normal heart sounds and intact distal pulses.  Pulmonary/Chest: Effort normal and breath sounds normal.  Abdominal: Soft. Bowel sounds are normal.  Neurological: She is alert and oriented to person, place, and time.  Skin: Skin is warm and dry. Capillary refill takes less than 2 seconds.  Psychiatric: She has a normal mood and affect. Her behavior is normal. Thought content normal.  Nursing note and vitals reviewed.     Assessment & Plan:  1. Intractable chronic migraine without aura and with status migrainosus  - divalproex (DEPAKOTE ER) 250 MG 24 hr tablet; Take 1 tablet (250 mg total) by mouth daily.  Dispense: 90 tablet; Refill: 1 - ketorolac (TORADOL) injection 60 mg - Follow up if not improved in the next 2-3 days  2. Medication refill  - triamcinolone ointment (KENALOG) 0.5 %; Apply 1 application topically 2 (two) times daily.  Dispense: 45 g; Refill: 3 - divalproex (DEPAKOTE ER) 250 MG 24 hr tablet; Take 1 tablet (250 mg total) by mouth daily.  Dispense: 90 tablet; Refill: 1  Shirline Frees, NP

## 2018-08-13 ENCOUNTER — Other Ambulatory Visit: Payer: Self-pay | Admitting: Adult Health

## 2018-08-13 DIAGNOSIS — M5412 Radiculopathy, cervical region: Secondary | ICD-10-CM

## 2018-08-16 MED ORDER — CYCLOBENZAPRINE HCL 10 MG PO TABS: 10.0000 mg | ORAL_TABLET | Freq: Three times a day (TID) | ORAL | 2 refills | Status: DC | PRN

## 2018-08-16 NOTE — Telephone Encounter (Signed)
Sent to the pharmacy by e-scribe per Cory. 

## 2018-08-16 NOTE — Telephone Encounter (Signed)
Ok to send in.  

## 2018-12-26 ENCOUNTER — Encounter: Payer: Self-pay | Admitting: Adult Health

## 2018-12-26 MED ORDER — LEVOCETIRIZINE DIHYDROCHLORIDE 5 MG PO TABS: 5.0000 mg | ORAL_TABLET | Freq: Every evening | ORAL | 0 refills | Status: DC

## 2019-01-11 ENCOUNTER — Encounter: Payer: Self-pay | Admitting: Adult Health

## 2019-01-11 DIAGNOSIS — J301 Allergic rhinitis due to pollen: Secondary | ICD-10-CM

## 2019-01-12 ENCOUNTER — Other Ambulatory Visit: Payer: Self-pay | Admitting: Adult Health

## 2019-01-12 DIAGNOSIS — J301 Allergic rhinitis due to pollen: Secondary | ICD-10-CM

## 2019-01-12 MED ORDER — AZELASTINE-FLUTICASONE 137-50 MCG/ACT NA SUSP: 1.0000 | Freq: Two times a day (BID) | NASAL | 5 refills | Status: DC

## 2019-02-28 ENCOUNTER — Ambulatory Visit: Payer: BC Managed Care – PPO | Admitting: Adult Health

## 2019-03-02 ENCOUNTER — Other Ambulatory Visit: Payer: Self-pay

## 2019-03-02 ENCOUNTER — Encounter: Payer: Self-pay | Admitting: Adult Health

## 2019-03-02 ENCOUNTER — Ambulatory Visit (INDEPENDENT_AMBULATORY_CARE_PROVIDER_SITE_OTHER): Payer: BC Managed Care – PPO | Admitting: Adult Health

## 2019-03-02 VITALS — BP 124/88 | Temp 98.4°F | Ht 65.75 in | Wt 172.0 lb

## 2019-03-02 DIAGNOSIS — Z0000 Encounter for general adult medical examination without abnormal findings: Secondary | ICD-10-CM

## 2019-03-02 DIAGNOSIS — G43801 Other migraine, not intractable, with status migrainosus: Secondary | ICD-10-CM | POA: Diagnosis not present

## 2019-03-02 DIAGNOSIS — Z0001 Encounter for general adult medical examination with abnormal findings: Secondary | ICD-10-CM

## 2019-03-02 DIAGNOSIS — M5412 Radiculopathy, cervical region: Secondary | ICD-10-CM

## 2019-03-02 LAB — CBC WITH DIFFERENTIAL/PLATELET
Basophils Absolute: 0 10*3/uL (ref 0.0–0.1)
Basophils Relative: 0.4 % (ref 0.0–3.0)
Eosinophils Absolute: 0.3 10*3/uL (ref 0.0–0.7)
Eosinophils Relative: 4.2 % (ref 0.0–5.0)
HCT: 40.2 % (ref 36.0–46.0)
Hemoglobin: 13.8 g/dL (ref 12.0–15.0)
Lymphocytes Relative: 29.2 % (ref 12.0–46.0)
Lymphs Abs: 1.9 10*3/uL (ref 0.7–4.0)
MCHC: 34.3 g/dL (ref 30.0–36.0)
MCV: 91.2 fl (ref 78.0–100.0)
Monocytes Absolute: 0.5 10*3/uL (ref 0.1–1.0)
Monocytes Relative: 8.3 % (ref 3.0–12.0)
Neutro Abs: 3.8 10*3/uL (ref 1.4–7.7)
Neutrophils Relative %: 57.9 % (ref 43.0–77.0)
Platelets: 206 10*3/uL (ref 150.0–400.0)
RBC: 4.41 Mil/uL (ref 3.87–5.11)
RDW: 13.4 % (ref 11.5–15.5)
WBC: 6.6 10*3/uL (ref 4.0–10.5)

## 2019-03-02 LAB — COMPREHENSIVE METABOLIC PANEL
ALT: 11 U/L (ref 0–35)
AST: 14 U/L (ref 0–37)
Albumin: 4.5 g/dL (ref 3.5–5.2)
Alkaline Phosphatase: 71 U/L (ref 39–117)
BUN: 10 mg/dL (ref 6–23)
CO2: 28 mEq/L (ref 19–32)
Calcium: 9.4 mg/dL (ref 8.4–10.5)
Chloride: 101 mEq/L (ref 96–112)
Creatinine, Ser: 0.77 mg/dL (ref 0.40–1.20)
GFR: 86.97 mL/min (ref >60.00)
Glucose, Bld: 87 mg/dL (ref 70–99)
Potassium: 3.9 mEq/L (ref 3.5–5.1)
Sodium: 138 mEq/L (ref 135–145)
Total Bilirubin: 0.5 mg/dL (ref 0.2–1.2)
Total Protein: 7.4 g/dL (ref 6.0–8.3)

## 2019-03-02 LAB — LIPID PANEL
Cholesterol: 186 mg/dL (ref 0–200)
HDL: 53.1 mg/dL (ref >39.00)
LDL Cholesterol: 107 mg/dL — ABNORMAL HIGH (ref 0–99)
NonHDL: 132.6
Total CHOL/HDL Ratio: 3
Triglycerides: 128 mg/dL (ref 0.0–149.0)
VLDL: 25.6 mg/dL (ref 0.0–40.0)

## 2019-03-02 LAB — TSH: TSH: 1.97 u[IU]/mL (ref 0.35–4.50)

## 2019-03-02 MED ORDER — NAPROXEN 500 MG PO TABS: 500.0000 mg | ORAL_TABLET | Freq: Two times a day (BID) | ORAL | 0 refills | Status: DC

## 2019-03-02 MED ORDER — RIZATRIPTAN BENZOATE 5 MG PO TABS: 5.0000 mg | ORAL_TABLET | ORAL | 3 refills | Status: DC | PRN

## 2019-03-02 MED ORDER — METHYLPREDNISOLONE 4 MG PO TBPK: ORAL_TABLET | ORAL | 0 refills | Status: DC

## 2019-03-02 NOTE — Progress Notes (Signed)
Subjective:    Patient ID: Samantha Hartman, female    DOB: Nov 05, 1985, 33 y.o.   MRN: [id number]  HPI Patient presents for yearly preventative medicine examination. Pleasant 33 year old female who  has a past medical history of Angio-edema, Asthma, Breast mass, GERD (gastroesophageal reflux disease), Migraines, and Wears glasses.  Migraines -only prescribed Depakote 250 mg extended release and Imitrex PRN.  She reports last month was stressful and she had more migraines than what she is accustomed to.  She would be interested in trying something different besides Imitrex as when she takes it "makes my brain feel like it is on fire".  Right shoulder pain-this is an acute issue that is been going on for approximately 7 to 10 days.  She has been taking Flexeril that she was prescribed at one point in time which helps temporarily with the pain.  She feels as though the pain is coming from underneath her right shoulder blade and she gets numbness tingling in weakness in her right arm.  Pain is worse at night when she lays down with range of motion.  She denies any aggravating factors.  All immunizations and health maintenance protocols were reviewed with the patient and needed orders were placed.  Appropriate screening laboratory values were ordered for the patient including screening of hyperlipidemia, renal function and hepatic function.  Medication reconciliation,  past medical history, social history, problem list and allergies were reviewed in detail with the patient  Goals were established with regard to weight loss, exercise, and  diet in compliance with medications  Seen by GYN and is up-to-date on Pap  Review of Systems  Constitutional: Negative.   HENT: Negative.   Eyes: Negative.   Respiratory: Negative.   Cardiovascular: Negative.   Gastrointestinal: Negative.   Endocrine: Negative.   Genitourinary: Negative.   Musculoskeletal: Positive for arthralgias.  Skin: Negative.    Allergic/Immunologic: Negative.   Neurological: Positive for headaches.  Hematological: Negative.   Psychiatric/Behavioral: Negative.    Past Medical History:  Diagnosis Date  . Angio-edema   . Asthma   . Breast mass   . GERD (gastroesophageal reflux disease)   . Migraines   . Wears glasses     Social History   Socioeconomic History  . Marital status: Married    Spouse name: Not on file  . Number of children: Not on file  . Years of education: Not on file  . Highest education level: Not on file  Occupational History  . Occupation: CMA    Employer: [employer]  Social Needs  . Financial resource strain: Not on file  . Food insecurity:    Worry: Not on file    Inability: Not on file  . Transportation needs:    Medical: Not on file    Non-medical: Not on file  Tobacco Use  . Smoking status: Never Smoker  . Smokeless tobacco: Never Used  Substance and Sexual Activity  . Alcohol use: No    Alcohol/week: 0.0 standard drinks  . Drug use: No  . Sexual activity: Yes    Birth control/protection: I.U.D.    Comment: 2014- placement Mirena IUD  Lifestyle  . Physical activity:    Days per week: Not on file    Minutes per session: Not on file  . Stress: Not on file  Relationships  . Social connections:    Talks on phone: Not on file    Gets together: Not on file    Attends religious service:  Not on file    Active member of club or organization: Not on file    Attends meetings of clubs or organizations: Not on file    Relationship status: Not on file  . Intimate partner violence:    Fear of current or ex partner: Not on file    Emotionally abused: Not on file    Physically abused: Not on file    Forced sexual activity: Not on file  Other Topics Concern  . Not on file  Social History Narrative  . Not on file    Past Surgical History:  Procedure Laterality Date  . APPENDECTOMY  April 2016  . LAPAROSCOPIC TUBAL LIGATION Bilateral 12/19/2015   Procedure:  LAPAROSCOPIC BILATERAL TUBAL LIGATION;  Surgeon: Richardean Chimera, MD;  Location: [facility];  Service: Gynecology;  Laterality: Bilateral;    Family History  Problem Relation Age of Onset  . Stroke Mother 34  . Hypertension Mother   . Prostate cancer Father           . Diabetes Mellitus II Father   . Asthma Father   . Asthma Maternal Grandfather   . Breast cancer Maternal Aunt   . Allergic rhinitis Neg Hx   . Angioedema Neg Hx   . Atopy Neg Hx   . Eczema Neg Hx   . Immunodeficiency Neg Hx   . Urticaria Neg Hx     Allergies  Allergen Reactions  . Other Itching and Swelling    APPLES, PINEAPPLE, KIWI  Cause itchy throat and mouth swells  . Percocet [Oxycodone-Acetaminophen] Itching    Current Outpatient Medications on File Prior to Visit  Medication Sig Dispense Refill  . albuterol (PROVENTIL HFA;VENTOLIN HFA) 108 (90 Base) MCG/ACT inhaler Inhale 2 puffs into the lungs every 6 (six) hours as needed for wheezing or shortness of breath. 1 Inhaler 5  . Azelastine-Fluticasone (DYMISTA) 137-50 MCG/ACT SUSP Place 1 spray into both nostrils 2 (two) times daily. 1 Bottle 5  . cyclobenzaprine (FLEXERIL) 10 MG tablet Take 1 tablet (10 mg total) by mouth 3 (three) times daily as needed for muscle spasms. 30 tablet 2  . levocetirizine (XYZAL) 5 MG tablet Take 1 tablet (5 mg total) by mouth every evening. 90 tablet 0  . montelukast (SINGULAIR) 10 MG tablet Take 1 tablet (10 mg total) by mouth at bedtime. 30 tablet 5  . naproxen (NAPROSYN) 500 MG tablet Take 1 tablet (500 mg total) by mouth every 12 (twelve) hours as needed. 30 tablet 3  . triamcinolone ointment (KENALOG) 0.5 % Apply 1 application topically 2 (two) times daily. 45 g 3  . divalproex (DEPAKOTE ER) 250 MG 24 hr tablet Take 1 tablet (250 mg total) by mouth daily. 90 tablet 1   No current facility-administered medications on file prior to visit.     BP 124/88   Temp 98.4 F (36.9 C)   Ht 5' 5.75" (1.67 m)   Wt  172 lb (78 kg)   BMI 27.97 kg/m       Objective:   Physical Exam Vitals signs and nursing note reviewed.  Constitutional:      Appearance: Normal appearance.  HENT:     Head: Normocephalic and atraumatic.     Right Ear: Tympanic membrane, ear canal and external ear normal. There is no impacted cerumen.     Left Ear: Tympanic membrane, ear canal and external ear normal. There is no impacted cerumen.     Nose: Nose normal. No congestion or rhinorrhea.  Mouth/Throat:     Mouth: Mucous membranes are moist.     Pharynx: Oropharynx is clear.  Eyes:     General: No scleral icterus.       Right eye: No discharge.        Left eye: No discharge.     Extraocular Movements: Extraocular movements intact.     Conjunctiva/sclera: Conjunctivae normal.     Pupils: Pupils are equal, round, and reactive to light.  Cardiovascular:     Rate and Rhythm: Normal rate and regular rhythm.     Pulses: Normal pulses.     Heart sounds: Normal heart sounds.  Pulmonary:     Effort: Pulmonary effort is normal.     Breath sounds: Normal breath sounds.  Abdominal:     General: Abdomen is flat. Bowel sounds are normal.     Palpations: Abdomen is soft.  Musculoskeletal: Normal range of motion.        General: Tenderness present. No swelling, deformity or signs of injury.     Right shoulder: She exhibits tenderness. She exhibits normal range of motion, no bony tenderness, no swelling, no crepitus, no deformity and normal strength.       Arms:     Right lower leg: No edema.     Left lower leg: No edema.  Skin:    General: Skin is warm and dry.     Capillary Refill: Capillary refill takes less than 2 seconds.  Neurological:     General: No focal deficit present.     Mental Status: She is alert and oriented to person, place, and time.  Psychiatric:        Mood and Affect: Mood normal.        Behavior: Behavior normal.        Thought Content: Thought content normal.        Judgment: Judgment normal.        Assessment & Plan:  1. Routine general medical examination at a health care facility - Work on lifestyle modifications  - Follow up in one year or sooner if needed  - CBC with Differential/Platelet - Comprehensive metabolic panel - Lipid panel - TSH  2. Other migraine with status migrainosus, not intractable - Will switch from Imitrex to MAxalt - rizatriptan (MAXALT) 5 MG tablet; Take 1 tablet (5 mg total) by mouth as needed for migraine. May repeat in 2 hours if needed  Dispense: 10 tablet; Refill: 3 - naproxen (NAPROSYN) 500 MG tablet; Take 1 tablet (500 mg total) by mouth 2 (two) times daily with a meal.  Dispense: 30 tablet; Refill: 0  3. Cervical radiculopathy  - methylPREDNISolone (MEDROL DOSEPAK) 4 MG TBPK tablet; Take as directed  Dispense: 21 tablet; Refill: 0  Shirline Frees, NP

## 2019-03-14 ENCOUNTER — Other Ambulatory Visit: Payer: Self-pay

## 2019-03-14 ENCOUNTER — Encounter: Payer: Self-pay | Admitting: Adult Health

## 2019-03-14 ENCOUNTER — Ambulatory Visit (INDEPENDENT_AMBULATORY_CARE_PROVIDER_SITE_OTHER): Payer: BC Managed Care – PPO | Admitting: Adult Health

## 2019-03-14 VITALS — BP 124/90 | Temp 98.1°F | Wt 172.0 lb

## 2019-03-14 DIAGNOSIS — M25511 Pain in right shoulder: Secondary | ICD-10-CM | POA: Diagnosis not present

## 2019-03-14 MED ORDER — METHYLPREDNISOLONE ACETATE 80 MG/ML IJ SUSP: 80.0000 mg | Freq: Once | INTRAMUSCULAR | Status: DC

## 2019-03-14 MED ORDER — METHYLPREDNISOLONE ACETATE 80 MG/ML IJ SUSP
80.0000 mg | Freq: Once | INTRAMUSCULAR | Status: AC
  Administered 2019-03-14: 11:00:00 80 mg via INTRA_ARTICULAR

## 2019-03-14 NOTE — Addendum Note (Signed)
Addended by: Raj Janus T on: 03/14/2019 10:51 AM   Modules accepted: Orders

## 2019-03-14 NOTE — Progress Notes (Signed)
Subjective:    Patient ID: Samantha SimonCarmen E Schack, female    DOB: [birth year], 33 y.o.   MRN: [id number]  HPI  33 year old female who  has a past medical history of Angio-edema, Asthma, Breast mass, GERD (gastroesophageal reflux disease), Migraines, and Wears glasses.  Being evaluated for continued right shoulder pain. She has failed therapy with medrol dose pack ad flexeril. Reports pain in the right shoulder is getting more intense and starting to radiate down her right arm and affect grip strength  Reports " it feels like the pain is coming from inside the shoulder.   Denies trauma or aggravating injury   Review of Systems See HPI   Past Medical History:  Diagnosis Date   Angio-edema    Asthma    Breast mass    GERD (gastroesophageal reflux disease)    Migraines    Wears glasses     Social History   Socioeconomic History   Marital status: Married    Spouse name: Not on file   Number of children: Not on file   Years of education: Not on file   Highest education level: Not on file  Occupational History   Occupation: CMA    Employer:   Social Needs   Financial resource strain: Not on file   Food insecurity:    Worry: Not on file    Inability: Not on file   Transportation needs:    Medical: Not on file    Non-medical: Not on file  Tobacco Use   Smoking status: Never Smoker   Smokeless tobacco: Never Used  Substance and Sexual Activity   Alcohol use: No    Alcohol/week: 0.0 standard drinks   Drug use: No   Sexual activity: Yes    Birth control/protection: I.U.D.    Comment: 2014- placement Mirena IUD  Lifestyle   Physical activity:    Days per week: Not on file    Minutes per session: Not on file   Stress: Not on file  Relationships   Social connections:    Talks on phone: Not on file    Gets together: Not on file    Attends religious service: Not on file    Active member of club or organization: Not on file    Attends  meetings of clubs or organizations: Not on file    Relationship status: Not on file   Intimate partner violence:    Fear of current or ex partner: Not on file    Emotionally abused: Not on file    Physically abused: Not on file    Forced sexual activity: Not on file  Other Topics Concern   Not on file  Social History Narrative   Not on file    Past Surgical History:  Procedure Laterality Date   APPENDECTOMY  April 2016   LAPAROSCOPIC TUBAL LIGATION Bilateral 12/19/2015   Procedure: LAPAROSCOPIC BILATERAL TUBAL LIGATION;  Surgeon: Richardean ChimeraJohn McComb, MD;  Location[facility];  Service: Gynecology;  Laterality: Bilateral;    Family History  Problem Relation Age of Onset   Stroke Mother 1446   Hypertension Mother    Prostate cancer Father            Diabetes Mellitus II Father    Asthma Father    Asthma Maternal Grandfather    Breast cancer Maternal Aunt    Allergic rhinitis Neg Hx    Angioedema Neg Hx    Atopy Neg Hx    Eczema  Neg Hx    Immunodeficiency Neg Hx    Urticaria Neg Hx     Allergies  Allergen Reactions   Other Itching and Swelling    APPLES, PINEAPPLE, KIWI  Cause itchy throat and mouth swells   Percocet [Oxycodone-Acetaminophen] Itching    Current Outpatient Medications on File Prior to Visit  Medication Sig Dispense Refill   albuterol (PROVENTIL HFA;VENTOLIN HFA) 108 (90 Base) MCG/ACT inhaler Inhale 2 puffs into the lungs every 6 (six) hours as needed for wheezing or shortness of breath. 1 Inhaler 5   Azelastine-Fluticasone (DYMISTA) 137-50 MCG/ACT SUSP Place 1 spray into both nostrils 2 (two) times daily. 1 Bottle 5   cyclobenzaprine (FLEXERIL) 10 MG tablet Take 1 tablet (10 mg total) by mouth 3 (three) times daily as needed for muscle spasms. 30 tablet 2   levocetirizine (XYZAL) 5 MG tablet Take 1 tablet (5 mg total) by mouth every evening. 90 tablet 0   methylPREDNISolone (MEDROL DOSEPAK) 4 MG TBPK tablet Take as directed  21 tablet 0   naproxen (NAPROSYN) 500 MG tablet Take 1 tablet (500 mg total) by mouth every 12 (twelve) hours as needed. 30 tablet 3   naproxen (NAPROSYN) 500 MG tablet Take 1 tablet (500 mg total) by mouth 2 (two) times daily with a meal. 30 tablet 0   rizatriptan (MAXALT) 5 MG tablet Take 1 tablet (5 mg total) by mouth as needed for migraine. May repeat in 2 hours if needed 10 tablet 3   triamcinolone ointment (KENALOG) 0.5 % Apply 1 application topically 2 (two) times daily. 45 g 3   divalproex (DEPAKOTE ER) 250 MG 24 hr tablet Take 1 tablet (250 mg total) by mouth daily. 90 tablet 1   No current facility-administered medications on file prior to visit.     BP 124/90    Temp 98.1 F (36.7 C)    Wt 172 lb (78 kg)    BMI 27.97 kg/m       Objective:   Physical Exam Vitals signs and nursing note reviewed.  Constitutional:      Appearance: Normal appearance.  Cardiovascular:     Rate and Rhythm: Normal rate and regular rhythm.     Pulses: Normal pulses.     Heart sounds: Normal heart sounds.  Pulmonary:     Effort: Pulmonary effort is normal.     Breath sounds: Normal breath sounds.  Musculoskeletal: Normal range of motion.        General: Tenderness present.     Comments: Tenderness with palpation throughout right shoulder.   Skin:    General: Skin is warm and dry.     Capillary Refill: Capillary refill takes less than 2 seconds.  Neurological:     General: No focal deficit present.     Mental Status: She is alert and oriented to person, place, and time.       Assessment & Plan:  1. Acute pain of right shoulder Discussed risks and benefits of corticosteroid injection and patient consented.  After prepping skin with betadine, injected 80 mg depomedrol and 2 cc of plain xylocaine with 22 gauge one and one half inch needle using anterolateral approach and pt tolerated well. - methylPREDNISolone acetate (DEPO-MEDROL) injection 80 mg   Shirline Frees, NP

## 2019-04-19 ENCOUNTER — Other Ambulatory Visit: Payer: Self-pay | Admitting: Adult Health

## 2019-04-19 DIAGNOSIS — G43801 Other migraine, not intractable, with status migrainosus: Secondary | ICD-10-CM

## 2019-04-19 MED ORDER — NAPROXEN 500 MG PO TABS: 500.0000 mg | ORAL_TABLET | Freq: Two times a day (BID) | ORAL | 0 refills | Status: DC

## 2019-04-19 MED ORDER — LEVOCETIRIZINE DIHYDROCHLORIDE 5 MG PO TABS: 5.0000 mg | ORAL_TABLET | Freq: Every evening | ORAL | 0 refills | Status: DC

## 2019-05-10 ENCOUNTER — Encounter: Payer: Self-pay | Admitting: Adult Health

## 2019-05-17 ENCOUNTER — Ambulatory Visit: Payer: Self-pay

## 2019-05-17 ENCOUNTER — Encounter: Payer: Self-pay | Admitting: Family Medicine

## 2019-05-17 ENCOUNTER — Other Ambulatory Visit: Payer: Self-pay

## 2019-05-17 ENCOUNTER — Other Ambulatory Visit: Payer: Self-pay | Admitting: Adult Health

## 2019-05-17 ENCOUNTER — Ambulatory Visit (INDEPENDENT_AMBULATORY_CARE_PROVIDER_SITE_OTHER): Payer: BC Managed Care – PPO | Admitting: Family Medicine

## 2019-05-17 ENCOUNTER — Encounter: Payer: Self-pay | Admitting: Adult Health

## 2019-05-17 DIAGNOSIS — M79601 Pain in right arm: Secondary | ICD-10-CM | POA: Diagnosis not present

## 2019-05-17 DIAGNOSIS — M542 Cervicalgia: Secondary | ICD-10-CM | POA: Diagnosis not present

## 2019-05-17 DIAGNOSIS — M25511 Pain in right shoulder: Secondary | ICD-10-CM

## 2019-05-17 MED ORDER — BACLOFEN 10 MG PO TABS: 5.0000 mg | ORAL_TABLET | Freq: Three times a day (TID) | ORAL | 1 refills | Status: DC | PRN

## 2019-05-17 MED ORDER — NABUMETONE 750 MG PO TABS: 750.0000 mg | ORAL_TABLET | Freq: Two times a day (BID) | ORAL | 6 refills | Status: DC | PRN

## 2019-05-17 MED ORDER — OXYCODONE-ACETAMINOPHEN 5-325 MG PO TABS: 1.0000 | ORAL_TABLET | Freq: Four times a day (QID) | ORAL | 0 refills | Status: DC | PRN

## 2019-05-17 NOTE — Progress Notes (Signed)
Office Visit Note   Patient: Samantha Hartman           Date of Birth: Jun 22, 1986           MRN: [id number] Visit Date: 05/17/2019 Requested by: Dorothyann Peng, NP [place] [place],  [place] [address] PCP: Dorothyann Peng, NP  Subjective: Chief Complaint  Patient presents with  . Right Shoulder - Pain  . Neck - Pain    Pain starting in right side of neck, radiating into posterior right shoulder and midway down upper right arm. No numbness/tingling in the arm/hand/fingers. Sometimes has to grip her hand around the upper arm to help with the pain.NKI    HPI: She is a 33 year old right-hand-dominant female with neck and right arm pain.  Symptoms started in May, no injury.  Started having fairly severe pain in the posterior shoulder and upper arm.  She went to her PCP who gave her a subacromial injection.  She has had this in the past on the left side and it worked very well again, but it did not help at all this time.  She has subsequently been treated with a steroid Dosepak and some other medications which have not seemed to help.  Her pain has worsened, now she cannot sleep at night.  It hurts when she bends her neck backward and leans to the right.  It feels better when she squeezes her right upper arm with her left hand.  Denies any weakness or numbness in her arm.               ROS: Denies fevers or chills.  All other systems were reviewed and are negative.  Objective: Vital Signs: There were no vitals taken for this visit.  Physical Exam:  General:  Alert and oriented, in no acute distress. Pulm:  Breathing unlabored. Psy:  Normal mood, congruent affect. Skin: No visible rash. Neck: Pain with Spurling's test on the right.  Tender to palpation near the C5-6 level to the right of midline, she has a symptomatic trigger point there as well.  She has another trigger point in the right rhomboid area that seems to reproduce some of her pain.  Full range of motion of the shoulder  with 5/5 rotator cuff strength throughout, pain-free.  Biceps, triceps, wrist and intrinsic hand strength are 5 out of 5 bilaterally.  Normal sensation in both hands.   Imaging: X-ray cervical spine: She appears to have some uncovertebral degenerative change at a couple levels, C4-5 and C5-6.  Slight narrowing of the lower cervical disc spaces and slight straightening of the cervical spine.  No other abnormality seen.    Assessment & Plan: 1.  Neck and right arm pain, concerning for cervical disc protrusion.  Neurologic exam is still nonfocal. -Trial of physical therapy, new medications given.  If she fails to improve quickly, she will contact me and we will order MRI cervical spine.     Procedures: No procedures performed  No notes on file     PMFS History: Patient Active Problem List   Diagnosis Date Noted  . Palpitations 01/09/2017  . Syncope and collapse 01/09/2017  . Migraine 08/11/2016  . Left breast mass 02/03/2016  . Asthma with acute exacerbation 01/21/2016  . Vulvar abscess 10/30/2015  . Allergic rhinitis 10/21/2015  . Lesion of oral mucosa 08/26/2015   Past Medical History:  Diagnosis Date  . Angio-edema   . Asthma   . Breast mass   . GERD (gastroesophageal  reflux disease)   . Migraines   . Wears glasses     Family History  Problem Relation Age of Onset  . Stroke Mother 3046  . Hypertension Mother   . Prostate cancer Father           . Diabetes Mellitus II Father   . Asthma Father   . Asthma Maternal Grandfather   . Breast cancer Maternal Aunt   . Allergic rhinitis Neg Hx   . Angioedema Neg Hx   . Atopy Neg Hx   . Eczema Neg Hx   . Immunodeficiency Neg Hx   . Urticaria Neg Hx     Past Surgical History:  Procedure Laterality Date  . APPENDECTOMY  April 2016  . LAPAROSCOPIC TUBAL LIGATION Bilateral 12/19/2015   Procedure: LAPAROSCOPIC BILATERAL TUBAL LIGATION;  Surgeon: Richardean ChimeraJohn McComb, MD;  [facility];  Service: Gynecology;   Laterality: Bilateral;   Social History   Occupational History  . Occupation: CMA    Employer: [employer]  Tobacco Use  . Smoking status: Never Smoker  . Smokeless tobacco: Never Used  Substance and Sexual Activity  . Alcohol use: No    Alcohol/week: 0.0 standard drinks  . Drug use: No  . Sexual activity: Yes    Birth control/protection: I.U.D.    Comment: 2014- placement Mirena IUD

## 2019-05-18 ENCOUNTER — Ambulatory Visit: Payer: BC Managed Care – PPO | Admitting: Adult Health

## 2019-05-23 ENCOUNTER — Other Ambulatory Visit: Payer: Self-pay

## 2019-05-23 ENCOUNTER — Encounter: Payer: Self-pay | Admitting: Physical Therapy

## 2019-05-23 ENCOUNTER — Ambulatory Visit: Payer: BC Managed Care – PPO | Attending: Family Medicine | Admitting: Physical Therapy

## 2019-05-23 DIAGNOSIS — M25511 Pain in right shoulder: Secondary | ICD-10-CM | POA: Insufficient documentation

## 2019-05-23 DIAGNOSIS — M6281 Muscle weakness (generalized): Secondary | ICD-10-CM | POA: Diagnosis present

## 2019-05-23 DIAGNOSIS — M542 Cervicalgia: Secondary | ICD-10-CM | POA: Insufficient documentation

## 2019-05-23 NOTE — Patient Instructions (Signed)
Access Code: [email or web address]  URL: [email or web address]  Date: 05/23/2019  Prepared by: Earlie Counts   Exercises Seated Cervical Sidebending Stretch - 2 reps - 1 sets - 15 sec hold - 1x daily - 7x weekly Seated Assisted Cervical Rotation with Towel - 2 reps - 1 sets - 15 sec hold - 1x daily - 7x weekly Neck Retraction - 5 reps - 1 sets - 5 sec hold - 4x daily - 7x weekly                  Patient Education [facility] [address], [place], [place] [address] Phone # [phone] Fax [phone]

## 2019-05-23 NOTE — Therapy (Signed)
[facility] [address], [address] [place], [place], [address] Phone: [phone]   Fax:  [phone]  Physical Therapy Evaluation  Patient Details  Name: Samantha Hartman MRN: [id number] Date of Birth: 1985/12/11 Referring Provider (PT): Dr. Lavada MesiMichael Hilts   Encounter Date: 05/23/2019  PT End of Session - 05/23/19 0840    Visit Number  1    Date for PT Re-Evaluation  07/04/19    Authorization Type  BCBS    PT Start Time  0800    PT Stop Time  0835    PT Time Calculation (min)  35 min    Activity Tolerance  Patient tolerated treatment well    Behavior During Therapy  [facility] for tasks assessed/performed       Past Medical History:  Diagnosis Date  . Angio-edema   . Asthma   . Breast mass   . GERD (gastroesophageal reflux disease)   . Migraines   . Wears glasses     Past Surgical History:  Procedure Laterality Date  . APPENDECTOMY  April 2016  . LAPAROSCOPIC TUBAL LIGATION Bilateral 12/19/2015   Procedure: LAPAROSCOPIC BILATERAL TUBAL LIGATION;  Surgeon: Richardean ChimeraJohn McComb, MD;  [facility];  Service: Gynecology;  Laterality: Bilateral;    There were no vitals filed for this visit.   Subjective Assessment - 05/23/19 0805    Subjective  Patient reports pain for 3 months. Patient had an injection to see if it will help. The injections and steroid did not work. 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    Patient Stated Goals  reduce pain    Currently in Pain?  Yes    Pain Score  5     Pain Location  Neck   right shoulder   Pain Orientation  Right    Pain Descriptors / Indicators  Throbbing;Tiring    Pain Type  Acute pain    Pain Radiating Towards  radiates to the right hand    Pain Onset  More than a month ago    Pain Frequency  Intermittent    Aggravating Factors   sleeping    Pain Relieving Factors  pressing on the right biceps, and hand;    Multiple  Pain Sites  No         [facility] PT Assessment - 05/23/19 0001      Assessment   Medical Diagnosis  M54.2 Neck pain; M79.601 Right arm pain    Referring Provider (PT)  Dr. Casimiro NeedleMichael Hilts    Onset Date/Surgical Date  02/10/19    Prior Therapy  None      Precautions   Precautions  None      Restrictions   Weight Bearing Restrictions  No      Balance Screen   Has the patient fallen in the past 6 months  No    Has the patient had a decrease in activity level because of a fear of falling?   No    Is the patient reluctant to leave their home because of a fear of falling?   No      Home Public house managernvironment   Living Environment  Private residence      Prior Function   Level of Independence  Independent    Vocation  Full time employment    Vocation Requirements  sitting,     Leisure  walking      Cognition   Overall Cognitive Status  Within Functional Limits for tasks assessed      Observation/Other Assessments   Focus on Therapeutic Outcomes (FOTO)   53% limitation; 33% goal      Sensation   Light Touch  Appears Intact      Posture/Postural Control   Posture/Postural Control  Postural limitations    Postural Limitations  Rounded Shoulders;Forward head      ROM / Strength   AROM / PROM / Strength  AROM;PROM;Strength      AROM   Cervical Flexion  45   pain   Cervical Extension  15   pain   Cervical - Right Side Bend  30   pain   Cervical - Left Side Bend  30    Cervical - Right Rotation  60    Cervical - Left Rotation  75      PROM   Right Shoulder Flexion  145 Degrees   pain     Strength   Right Shoulder Flexion  4/5    Right Shoulder ABduction  4/5      Palpation   Palpation comment  tenderness located in right cervical paraspinals especially at C4-C6, tenderness in right interscapular and posterior shoulder      Special Tests    Special Tests  Cervical    Cervical Tests  Dictraction      Distraction Test   Findngs  Positive    side  Right    Comment   decreases pain                Objective measurements completed on examination: See above findings.      [facility] Adult PT Treatment/Exercise - 05/23/19 0001      Manual Therapy   Manual Therapy  Soft tissue mobilization;Joint mobilization    Joint Mobilization  to right side of C4-C6 for sideglide and upslide grade 3    Soft tissue mobilization  bil. cervical paraspinals      Neck Exercises: Stretches   Other Neck Stretches  cervical retraction 5 times    Other Neck Stretches  cervical rotation using towel ; sidebending stretch       Trigger Point Dry Needling - 05/23/19 0001    Consent Given?  Yes    Education Handout Provided  Yes    Muscles Treated Head and Neck  Cervical multifidi    Cervical multifidi Response  Twitch reponse elicited;Palpable increased muscle length           PT Education - 05/23/19 0838    Education Details  Access Code: [email or web address]    Person(s) Educated  Patient    Methods  Explanation;Demonstration;Verbal cues;Handout    Comprehension  Verbalized understanding;Returned demonstration       PT Short Term Goals - 05/23/19 0849      PT SHORT TERM GOAL #1   Title  independent with intiial HEP    Time  3    Period  Weeks    Status  New    Target Date  06/13/19      PT SHORT TERM GOAL #2   Title  able to look up with 25% less pain in cervical due to improved mobility    Time  3    Period  Weeks    Status  New    Target Date  06/13/19      PT SHORT TERM GOAL #3   Title  full right shoulder flexion and abduction A/AROM due to decreased pain and reduction of tissue tightness  Time  3    Period  Weeks    Status  New    Target Date  06/13/19      PT SHORT TERM GOAL #4   Title  sleep has improved >/= 25% due to reduction of pain    Time  3    Period  Weeks    Status  New    Target Date  06/13/19        PT Long Term Goals - 05/23/19 1232      PT LONG TERM GOAL #1   Title  independent with HEP and understand how to progress  herself    Time  6    Period  Weeks    Status  New    Target Date  07/04/19      PT LONG TERM GOAL #2   Title  able to turn her head to look behind her without difficulty due to full cervical rotation    Time  6    Period  Weeks    Status  New    Target Date  07/04/19      PT LONG TERM GOAL #3   Title  able to look overhead for items with minimal to no pain and full extension    Time  6    Period  Weeks    Status  New    Target Date  07/04/19      PT LONG TERM GOAL #4   Title  FOTO score </= 33% limitation    Time  6    Period  Weeks    Status  New    Target Date  07/04/19      PT LONG TERM GOAL #5   Title  no difficulty with sleeping due to reduction of cervical and right arm pain >/= 75%    Time  6    Period  Weeks    Status  New    Target Date  07/04/19             Plan - 05/23/19 0840    Clinical Impression Statement  Patient is a 33 year old female with cervical pain that radiates into the right arm with sudden onset since 5/20. Patient reports intermittent pain in right cervical and arm at level 5/10. Patient has tenderness located in right cervical paraspinals and suboccipitals and posterior right shoulder. Patient responded well to dry needling to cervical and reduced pain. Cervical manaual distraction reduces pain. Patient has limited cervical ROM with pain and decreased right shoulder flexion and abduction A/AROM. Patient posture consists of forward head and rounded shoulders. Patient would benefit from skilled therapy to reduce pain, imporve cervical ROM and improve functional tasks.    Personal Factors and Comorbidities  Profession    Examination-Activity Limitations  Reach Overhead;Sleep;Lift    Examination-Participation Restrictions  Cleaning    Stability/Clinical Decision Making  Evolving/Moderate complexity    Clinical Decision Making  Low    Rehab Potential  Excellent    PT Frequency  2x / week    PT Duration  6 weeks    PT Treatment/Interventions   Cryotherapy;Electrical Stimulation;Moist Heat;Traction;Ultrasound;Therapeutic exercise;Neuromuscular re-education;Therapeutic activities;Patient/family education;Dry needling;Passive range of motion;Manual techniques;Spinal Manipulations    PT Next Visit Plan  cervical traction; joint mobilization to cervical; assess dry needling maybe do on interscapular area; right shoulder endrange mobilization and stretch for flexion and abduction; posture with work tasks    PT Home Exercise Plan  Access Code: [email or web address]  Consulted and Agree with Plan of Care  Patient       Patient will benefit from skilled therapeutic intervention in order to improve the following deficits and impairments:  Decreased range of motion, Increased fascial restricitons, Increased muscle spasms, Pain, Decreased activity tolerance, Impaired flexibility, Decreased mobility, Decreased strength, Postural dysfunction  Visit Diagnosis: 1. Cervicalgia   2. Acute pain of right shoulder   3. Muscle weakness (generalized)        Problem List Patient Active Problem List   Diagnosis Date Noted  . Palpitations 01/09/2017  . Syncope and collapse 01/09/2017  . Migraine 08/11/2016  . Left breast mass 02/03/2016  . Asthma with acute exacerbation 01/21/2016  . Vulvar abscess 10/30/2015  . Allergic rhinitis 10/21/2015  . Lesion of oral mucosa 08/26/2015    Earlie Counts, PT 05/23/19 12:36 PM   [facility] [address], [place], [place], [address] Phone: [phone]   Fax:  [phone]  Name: Samantha Hartman MRN: [id number] Date of Birth: 1986/03/16

## 2019-05-25 ENCOUNTER — Ambulatory Visit: Payer: BC Managed Care – PPO | Admitting: Physical Therapy

## 2019-05-25 ENCOUNTER — Encounter: Payer: Self-pay | Admitting: Physical Therapy

## 2019-05-25 ENCOUNTER — Other Ambulatory Visit: Payer: Self-pay

## 2019-05-25 DIAGNOSIS — M25511 Pain in right shoulder: Secondary | ICD-10-CM

## 2019-05-25 DIAGNOSIS — M542 Cervicalgia: Secondary | ICD-10-CM

## 2019-05-25 DIAGNOSIS — M6281 Muscle weakness (generalized): Secondary | ICD-10-CM

## 2019-05-25 NOTE — Patient Instructions (Signed)
Access Code: [email or web address]  URL: [email or web address]  Date: 05/25/2019  Prepared by: Earlie Counts   Exercises Seated Cervical Sidebending Stretch - 2 reps - 1 sets - 15 sec hold - 1x daily - 7x weekly Seated Assisted Cervical Rotation with Towel - 2 reps - 1 sets - 15 sec hold - 1x daily - 7x weekly Neck Retraction - 5 reps - 1 sets - 5 sec hold - 4x daily - 7x weekly                  Median Nerve Tensioner - 5 reps - 1 sets - 3x daily - 7x weekly Median nerve abduction sliders - 5 reps - 1 sets - 3x daily - 7x weekly Patient Education Trigger Point [facility] [address] [place], [place] [address] Phone # [phone] Fax [phone]

## 2019-05-25 NOTE — Therapy (Signed)
[facility] [address], [place], [place], [address] Phone: [phone]   Fax:  [phone]  Physical Therapy Treatment  Patient Details  Name: Samantha Hartman MRN: [id number] Date of Birth: 30-Dec-1985 Referring Provider (PT): Dr. Eunice Blase   Encounter Date: 05/25/2019  PT End of Session - 05/25/19 0838    Visit Number  2    Date for PT Re-Evaluation  07/04/19    Authorization Type  BCBS    PT Start Time  0800    PT Stop Time  0850    PT Time Calculation (min)  50 min    Activity Tolerance  Patient tolerated treatment well;No increased pain    Behavior During Therapy  WFL for tasks assessed/performed       Past Medical History:  Diagnosis Date  . Angio-edema   . Asthma   . Breast mass   . GERD (gastroesophageal reflux disease)   . Migraines   . Wears glasses     Past Surgical History:  Procedure Laterality Date  . APPENDECTOMY  April 2016  . LAPAROSCOPIC TUBAL LIGATION Bilateral 12/19/2015   Procedure: LAPAROSCOPIC BILATERAL TUBAL LIGATION;  Surgeon: Arvella Nigh, MD;  Location: [facility];  Service: Gynecology;  Laterality: Bilateral;    There were no vitals filed for this visit.  Subjective Assessment - 05/25/19 0801    Subjective  The pain has been better since initial evaluation. Last night I had difficulty with sleeping.    Patient Stated Goals  reduce pain    Currently in Pain?  Yes    Pain Score  4     Pain Location  Neck   right shoulder   Pain Orientation  Right    Pain Descriptors / Indicators  Tiring    Pain Type  Acute pain    Pain Radiating Towards  radiates to the right hand    Pain Onset  More than a month ago    Pain Frequency  Intermittent    Aggravating Factors   sleeping    Pain Relieving Factors  pressing on the right biceps, and hand    Multiple Pain Sites  No                       [facility] Adult PT Treatment/Exercise - 05/25/19 0001      Neuro Re-ed    Neuro Re-ed Details   neural tension stretch for right median nerve in sitting      Modalities   Modalities  Traction      Traction   Type of Traction  Cervical    Min (lbs)  5    Max (lbs)  13    Time  15 min      Manual Therapy   Manual Therapy  Joint mobilization;Soft tissue mobilization    Joint Mobilization  sideglide to C4-C7     Soft tissue mobilization  right interscapular, right RTC, right levator scapulae, right upper trap       Trigger Point Dry Needling - 05/25/19 0001    Consent Given?  Yes    Muscles Treated Head and Neck  Levator scapulae   right   Muscles Treated Upper Quadrant  Rhomboids   right   Other Dry Needling  right thoracic multifidi T1-T4    Levator Scapulae Response  Twitch response elicited;Palpable increased muscle length    Rhomboids Response  Twitch response elicited;Palpable increased muscle length  PT Education - 05/25/19 0830    Education Details  Access Code: [email or web address]    Person(s) Educated  Patient    Methods  Explanation;Demonstration;Verbal cues;Handout    Comprehension  Verbalized understanding;Returned demonstration       PT Short Term Goals - 05/23/19 0849      PT SHORT TERM GOAL #1   Title  independent with intiial HEP    Time  3    Period  Weeks    Status  New    Target Date  06/13/19      PT SHORT TERM GOAL #2   Title  able to look up with 25% less pain in cervical due to improved mobility    Time  3    Period  Weeks    Status  New    Target Date  06/13/19      PT SHORT TERM GOAL #3   Title  full right shoulder flexion and abduction A/AROM due to decreased pain and reduction of tissue tightness    Time  3    Period  Weeks    Status  New    Target Date  06/13/19      PT SHORT TERM GOAL #4   Title  sleep has improved >/= 25% due to reduction of pain    Time  3    Period  Weeks    Status  New    Target Date  06/13/19        PT Long Term Goals - 05/23/19 1232      PT LONG TERM  GOAL #1   Title  independent with HEP and understand how to progress herself    Time  6    Period  Weeks    Status  New    Target Date  07/04/19      PT LONG TERM GOAL #2   Title  able to turn her head to look behind her without difficulty due to full cervical rotation    Time  6    Period  Weeks    Status  New    Target Date  07/04/19      PT LONG TERM GOAL #3   Title  able to look overhead for items with minimal to no pain and full extension    Time  6    Period  Weeks    Status  New    Target Date  07/04/19      PT LONG TERM GOAL #4   Title  FOTO score </= 33% limitation    Time  6    Period  Weeks    Status  New    Target Date  07/04/19      PT LONG TERM GOAL #5   Title  no difficulty with sleeping due to reduction of cervical and right arm pain >/= 75%    Time  6    Period  Weeks    Status  New    Target Date  07/04/19            Plan - 05/25/19 0803    Clinical Impression Statement  Patient is still having trouble with sleeping. Patient has tightness in the right cervical and interscapular area. Patient has fascial thickness in the T1-T4 area. Patient felt better after the initial evaluation. Patient responded well to dry needling and reduce pain and improved tissue mobility. Patient will benefit from skilled therapy to reduce pain, improve cervical ROM and improve functional  tasks.    Personal Factors and Comorbidities  Profession    Examination-Activity Limitations  Reach Overhead;Sleep;Lift    Examination-Participation Restrictions  Cleaning    Stability/Clinical Decision Making  Evolving/Moderate complexity    Rehab Potential  Excellent    PT Frequency  2x / week    PT Duration  6 weeks    PT Treatment/Interventions  Cryotherapy;Electrical Stimulation;Moist Heat;Traction;Ultrasound;Therapeutic exercise;Neuromuscular re-education;Therapeutic activities;Patient/family education;Dry needling;Passive range of motion;Manual techniques;Spinal Manipulations     PT Next Visit Plan  cervical traction; joint mobilization to cervical; dry needling cervical and subocciptial; right shoulder endrange mobilization and stretch for flexion and abduction; posture with work tasks    PT Home Exercise Plan  Access Code: [email or web address]    Recommended Other Services  MD signed initial note    Consulted and Agree with Plan of Care  Patient       Patient will benefit from skilled therapeutic intervention in order to improve the following deficits and impairments:  Decreased range of motion, Increased fascial restricitons, Increased muscle spasms, Pain, Decreased activity tolerance, Impaired flexibility, Decreased mobility, Decreased strength, Postural dysfunction  Visit Diagnosis: 1. Cervicalgia   2. Acute pain of right shoulder   3. Muscle weakness (generalized)        Problem List Patient Active Problem List   Diagnosis Date Noted  . Palpitations 01/09/2017  . Syncope and collapse 01/09/2017  . Migraine 08/11/2016  . Left breast mass 02/03/2016  . Asthma with acute exacerbation 01/21/2016  . Vulvar abscess 10/30/2015  . Allergic rhinitis 10/21/2015  . Lesion of oral mucosa 08/26/2015    Eulis Fosterheryl Gray, PT 05/25/19 8:42 AM   [facility] [address], [address] [place], [place], [address] Phone: [phone]   Fax:  [phone]  Name: Samantha Hartman MRN: [id number] Date of Birth: 1986/02/28

## 2019-05-27 ENCOUNTER — Encounter: Payer: Self-pay | Admitting: Family Medicine

## 2019-05-27 DIAGNOSIS — M542 Cervicalgia: Secondary | ICD-10-CM

## 2019-06-01 ENCOUNTER — Ambulatory Visit: Payer: BC Managed Care – PPO

## 2019-06-01 ENCOUNTER — Other Ambulatory Visit: Payer: Self-pay

## 2019-06-01 DIAGNOSIS — M6281 Muscle weakness (generalized): Secondary | ICD-10-CM

## 2019-06-01 DIAGNOSIS — M542 Cervicalgia: Secondary | ICD-10-CM | POA: Diagnosis not present

## 2019-06-01 DIAGNOSIS — M25511 Pain in right shoulder: Secondary | ICD-10-CM

## 2019-06-01 NOTE — Therapy (Signed)
[facility] [address], [address] [place], [place], [address] Phone: [phone]   Fax:  [phone]  Physical Therapy Treatment  Patient Details  Name: Samantha SimonCarmen E Smullen MRN: [id number] Date of Birth: 1986/02/24 Referring Provider (PT): Dr. Lavada MesiMichael Hilts   Encounter Date: 06/01/2019  PT End of Session - 06/01/19 0807    Visit Number  3    Date for PT Re-Evaluation  07/04/19    Authorization Type  BCBS    PT Start Time  0731    PT Stop Time  0819    PT Time Calculation (min)  48 min    Activity Tolerance  Patient tolerated treatment well;No increased pain    Behavior During Therapy  WFL for tasks assessed/performed       Past Medical History:  Diagnosis Date  . Angio-edema   . Asthma   . Breast mass   . GERD (gastroesophageal reflux disease)   . Migraines   . Wears glasses     Past Surgical History:  Procedure Laterality Date  . APPENDECTOMY  April 2016  . LAPAROSCOPIC TUBAL LIGATION Bilateral 12/19/2015   Procedure: LAPAROSCOPIC BILATERAL TUBAL LIGATION;  Surgeon: Richardean ChimeraJohn McComb, MD;  [facility];  Service: Gynecology;  Laterality: Bilateral;    There were no vitals filed for this visit.  Subjective Assessment - 06/01/19 0733    Subjective  The pain in my Rt arm is the same.  I contacted MD and he is going to order and MRI- 06/08/19.    Currently in Pain?  Yes    Pain Score  5     Pain Location  Neck    Pain Orientation  Right    Pain Descriptors / Indicators  Sharp;Tiring    Pain Type  Acute pain    Pain Onset  More than a month ago    Pain Frequency  Intermittent    Aggravating Factors   cervical extension, lifting with Rt UE    Pain Relieving Factors  medication                       [facility] Adult PT Treatment/Exercise - 06/01/19 0001      Traction   Type of Traction  Cervical    Min (lbs)  5    Max (lbs)  15    Time  15 min      Manual Therapy   Manual Therapy   Soft tissue mobilization;Myofascial release    Manual therapy comments  elongation to Rt>Lt cervical paraspinals, upper traps and levator       Trigger Point Dry Needling - 06/01/19 0001    Consent Given?  Yes    Muscles Treated Head and Neck  Cervical multifidi;Levator scapulae    Levator Scapulae Response  Twitch response elicited;Palpable increased muscle length    Cervical multifidi Response  Twitch reponse elicited;Palpable increased muscle length             PT Short Term Goals - 06/01/19 0736      PT SHORT TERM GOAL #1   Title  independent with intiial HEP    Status  Achieved      PT SHORT TERM GOAL #3   Title  full right shoulder flexion and abduction A/AROM due to decreased pain and reduction of tissue tightness    Baseline  full A/ROM    Status  Achieved      PT SHORT TERM GOAL #4  Title  sleep has improved >/= 25% due to reduction of pain    Baseline  70% improvement in sleep    Status  Achieved        PT Long Term Goals - 05/23/19 1232      PT LONG TERM GOAL #1   Title  independent with HEP and understand how to progress herself    Time  6    Period  Weeks    Status  New    Target Date  07/04/19      PT LONG TERM GOAL #2   Title  able to turn her head to look behind her without difficulty due to full cervical rotation    Time  6    Period  Weeks    Status  New    Target Date  07/04/19      PT LONG TERM GOAL #3   Title  able to look overhead for items with minimal to no pain and full extension    Time  6    Period  Weeks    Status  New    Target Date  07/04/19      PT LONG TERM GOAL #4   Title  FOTO score </= 33% limitation    Time  6    Period  Weeks    Status  New    Target Date  07/04/19      PT LONG TERM GOAL #5   Title  no difficulty with sleeping due to reduction of cervical and right arm pain >/= 75%    Time  6    Period  Weeks    Status  New    Target Date  07/04/19            Plan - 06/01/19 0806    Clinical  Impression Statement  Pt reports that she continues to have pain in the Rt UE that is unchanged.  Pt does report that sleep is improved by 70%.  Pt demonstrates full Rt UE A/ROM in all directions with minimal upper trap/rhomboid discomfort.  Pt with tension in Rt>Lt cervical multifidi and upper traps.  Traction was increased to 15 lbs today and pt tolerated well.  Pt is independent and compliant in HEP for flexibility and nerve mobility.  Pt will continue to benefit from skilled PT for strength, flexibility, traction and manual to address Rt UE radiculopathy.    Rehab Potential  Excellent    PT Frequency  2x / week    PT Duration  6 weeks    PT Treatment/Interventions  Cryotherapy;Electrical Stimulation;Moist Heat;Traction;Ultrasound;Therapeutic exercise;Neuromuscular re-education;Therapeutic activities;Patient/family education;Dry needling;Passive range of motion;Manual techniques;Spinal Manipulations    PT Next Visit Plan  cervical traction; joint mobilization to cervical; dry needling cervical and subocciptial; right shoulder endrange mobilization and stretch for flexion and abduction; postural strenght in supine    PT Home Exercise Plan  Access Code: [email or web address]    Consulted and Agree with Plan of Care  Patient       Patient will benefit from skilled therapeutic intervention in order to improve the following deficits and impairments:  Decreased range of motion, Increased fascial restricitons, Increased muscle spasms, Pain, Decreased activity tolerance, Impaired flexibility, Decreased mobility, Decreased strength, Postural dysfunction  Visit Diagnosis: 1. Cervicalgia   2. Acute pain of right shoulder   3. Muscle weakness (generalized)        Problem List Patient Active Problem List   Diagnosis Date Noted  . Palpitations 01/09/2017  .  Syncope and collapse 01/09/2017  . Migraine 08/11/2016  . Left breast mass 02/03/2016  . Asthma with acute exacerbation 01/21/2016  . Vulvar abscess  10/30/2015  . Allergic rhinitis 10/21/2015  . Lesion of oral mucosa 08/26/2015    Lorrene ReidKelly Shardee Dieu, PT 06/01/19 8:09 AM  [facility] [address], [address] [place], [place], [address] Phone: [phone]   Fax:  [phone]  Name: Samantha SimonCarmen E Peddie MRN: [id number] Date of Birth: 06-13-1986

## 2019-06-06 ENCOUNTER — Ambulatory Visit: Payer: BC Managed Care – PPO

## 2019-06-06 ENCOUNTER — Encounter: Payer: Self-pay | Admitting: Adult Health

## 2019-06-07 ENCOUNTER — Other Ambulatory Visit: Payer: Self-pay | Admitting: Adult Health

## 2019-06-07 MED ORDER — DIAZEPAM 5 MG PO TABS: ORAL_TABLET | ORAL | 0 refills | Status: DC

## 2019-06-08 ENCOUNTER — Ambulatory Visit
Admission: RE | Admit: 2019-06-08 | Discharge: 2019-06-08 | Disposition: A | Discharge status: Home / Self Care | Payer: BC Managed Care – PPO | Source: Ambulatory Visit | Attending: Family Medicine | Admitting: Family Medicine

## 2019-06-08 ENCOUNTER — Other Ambulatory Visit: Payer: Self-pay

## 2019-06-08 ENCOUNTER — Ambulatory Visit: Payer: BC Managed Care – PPO

## 2019-06-08 DIAGNOSIS — M542 Cervicalgia: Secondary | ICD-10-CM

## 2019-06-09 ENCOUNTER — Encounter: Payer: Self-pay | Admitting: Family Medicine

## 2019-06-09 ENCOUNTER — Telehealth: Payer: Self-pay | Admitting: Family Medicine

## 2019-06-09 MED ORDER — BACLOFEN 10 MG PO TABS: 5.0000 mg | ORAL_TABLET | Freq: Three times a day (TID) | ORAL | 1 refills | Status: DC | PRN

## 2019-06-09 NOTE — Telephone Encounter (Signed)
Cervical spine MRI scan shows the following:  There is a disc bulge with bone spurs at C4-5 and C5-6 toward the right which could affect the right C5 and C6 nerves.  This would explain the right arm pain.  There is a small protrusion toward the left at C6-7 but it is not contacting any nerves.

## 2019-06-13 ENCOUNTER — Other Ambulatory Visit: Payer: Self-pay

## 2019-06-13 ENCOUNTER — Ambulatory Visit: Payer: BC Managed Care – PPO | Attending: Family Medicine

## 2019-06-13 DIAGNOSIS — M25511 Pain in right shoulder: Secondary | ICD-10-CM | POA: Insufficient documentation

## 2019-06-13 DIAGNOSIS — M6281 Muscle weakness (generalized): Secondary | ICD-10-CM | POA: Insufficient documentation

## 2019-06-13 DIAGNOSIS — M542 Cervicalgia: Secondary | ICD-10-CM | POA: Insufficient documentation

## 2019-06-13 NOTE — Therapy (Signed)
[facility] [address], [address] [place], [place], [address] Phone: [phone]   Fax:  [phone]  Physical Therapy Treatment  Patient Details  Name: Edison SimonCarmen E Harwood MRN: [id number] Date of Birth: [birth year] Referring Provider (PT): Dr. Lavada MesiMichael Hilts   Encounter Date: 06/13/2019  PT End of Session - 06/13/19 0852    Visit Number  4    Date for PT Re-Evaluation  07/04/19    Authorization Type  BCBS    PT Start Time  0814    PT Stop Time  0903    PT Time Calculation (min)  49 min    Activity Tolerance  Patient tolerated treatment well;No increased pain    Behavior During Therapy  WFL for tasks assessed/performed       Past Medical History:  Diagnosis Date  . Angio-edema   . Asthma   . Breast mass   . GERD (gastroesophageal reflux disease)   . Migraines   . Wears glasses     Past Surgical History:  Procedure Laterality Date  . APPENDECTOMY  April 2016  . LAPAROSCOPIC TUBAL LIGATION Bilateral 12/19/2015   Procedure: LAPAROSCOPIC BILATERAL TUBAL LIGATION;  Surgeon: Richardean ChimeraJohn McComb, MD;  [facility] [place];  Service: Gynecology;  Laterality: Bilateral;    There were no vitals filed for this visit.  Subjective Assessment - 06/13/19 0814    Subjective  Pt had MRI- small disc bulge and bone spur on the Rt.  No surgery, MD wants her to continue with PT.    Currently in Pain?  No/denies   up to 9/10 in the neck and arm                      [facility] Adult PT Treatment/Exercise - 06/13/19 0001      Traction   Type of Traction  Cervical    Min (lbs)  5    Max (lbs)  15    Hold Time  60    Rest Time  10    Time  15 min      Manual Therapy   Manual Therapy  Soft tissue mobilization;Myofascial release    Manual therapy comments  elongation to Rt>Lt cervical paraspinals, upper traps and levator       Trigger Point Dry Needling - 06/13/19 0001    Consent Given?  Yes    Muscles Treated  Head and Neck  Cervical multifidi;Levator scapulae;Upper trapezius   Rt only   Upper Trapezius Response  Twitch reponse elicited;Palpable increased muscle length    Levator Scapulae Response  Twitch response elicited;Palpable increased muscle length    Cervical multifidi Response  Twitch reponse elicited;Palpable increased muscle length             PT Short Term Goals - 06/13/19 0855      PT SHORT TERM GOAL #2   Title  able to look up with 25% less pain in cervical due to improved mobility    Status  Achieved      PT SHORT TERM GOAL #3   Title  full right shoulder flexion and abduction A/AROM due to decreased pain and reduction of tissue tightness    Status  Achieved      PT SHORT TERM GOAL #4   Title  sleep has improved >/= 25% due to reduction of pain    Status  Achieved        PT Long Term Goals - 06/13/19 [id number]  PT LONG TERM GOAL #2   Title  able to turn her head to look behind her without difficulty due to full cervical rotation    Time  6    Period  Weeks    Status  On-going            Plan - 06/13/19 2841    Clinical Impression Statement  Pt reports 75% overall reduction in symptoms since the start of care.  Recent MRI showed Rt foraminal encroachement at C5-6 and MD recommended PT and an injection. Pt with tension and trigger points in bil cervical multifidi and Rt upper trap/levator.  Pt demonstrated improved tissue mobility after manual therapy today.  Pt reports symptom reduction with traction.  Pt demonstrates poor seated posture and PT will address postural strength next session.    Examination-Activity Limitations  Reach Overhead;Sleep;Lift    PT Frequency  2x / week    PT Duration  6 weeks    PT Treatment/Interventions  Cryotherapy;Electrical Stimulation;Moist Heat;Traction;Ultrasound;Therapeutic exercise;Neuromuscular re-education;Therapeutic activities;Patient/family education;Dry needling;Passive range of motion;Manual techniques;Spinal  Manipulations    PT Next Visit Plan  assess response to dry needling and traction, add to HEP: postural strength with theraband.    PT Home Exercise Plan  Access Code: [email or web address]    Consulted and Agree with Plan of Care  Patient       Patient will benefit from skilled therapeutic intervention in order to improve the following deficits and impairments:  Decreased range of motion, Increased fascial restricitons, Increased muscle spasms, Pain, Decreased activity tolerance, Impaired flexibility, Decreased mobility, Decreased strength, Postural dysfunction  Visit Diagnosis: Cervicalgia  Acute pain of right shoulder  Muscle weakness (generalized)     Problem List Patient Active Problem List   Diagnosis Date Noted  . Palpitations 01/09/2017  . Syncope and collapse 01/09/2017  . Migraine 08/11/2016  . Left breast mass 02/03/2016  . Asthma with acute exacerbation 01/21/2016  . Vulvar abscess 10/30/2015  . Allergic rhinitis 10/21/2015  . Lesion of oral mucosa 08/26/2015    [name], PT 06/13/19 8:56 AM  [facility] [address] [place], [place], [address] Phone: [phone]   Fax:  [phone]  Name: ALESA ECHEVARRIA MRN: [id number] Date of Birth: [birth year]

## 2019-06-15 ENCOUNTER — Ambulatory Visit: Payer: BC Managed Care – PPO

## 2019-06-15 ENCOUNTER — Other Ambulatory Visit: Payer: Self-pay

## 2019-06-15 DIAGNOSIS — M542 Cervicalgia: Secondary | ICD-10-CM | POA: Diagnosis not present

## 2019-06-15 DIAGNOSIS — M25511 Pain in right shoulder: Secondary | ICD-10-CM

## 2019-06-15 DIAGNOSIS — M6281 Muscle weakness (generalized): Secondary | ICD-10-CM

## 2019-06-15 NOTE — Therapy (Addendum)
[facility] [address] [place], [place], [address] Phone: [phone]   Fax:  [phone]  Physical Therapy Treatment  Patient Details  Name: Samantha Hartman MRN: [id number] Date of Birth: Nov 17, 1985 Referring Provider (PT): Dr. Eunice Blase   Encounter Date: 06/15/2019  PT End of Session - 06/15/19 0809    Visit Number  5    Date for PT Re-Evaluation  07/04/19    Authorization Type  BCBS    PT Start Time  0731    PT Stop Time  0821    PT Time Calculation (min)  50 min    Activity Tolerance  Patient tolerated treatment well;No increased pain    Behavior During Therapy  WFL for tasks assessed/performed       Past Medical History:  Diagnosis Date  . Angio-edema   . Asthma   . Breast mass   . GERD (gastroesophageal reflux disease)   . Migraines   . Wears glasses     Past Surgical History:  Procedure Laterality Date  . APPENDECTOMY  April 2016  . LAPAROSCOPIC TUBAL LIGATION Bilateral 12/19/2015   Procedure: LAPAROSCOPIC BILATERAL TUBAL LIGATION;  Surgeon: Arvella Nigh, MD;  Location: [place];  Service: Gynecology;  Laterality: Bilateral;    There were no vitals filed for this visit.  Subjective Assessment - 06/15/19 0733    Subjective  I felt good after last visit.  I am having increased pain in my Rt shoulder blade today and didn't sleep week.    Currently in Pain?  Yes    Pain Score  6     Pain Location  Scapula    Pain Orientation  Right    Pain Descriptors / Indicators  Sharp    Pain Type  Acute pain    Pain Onset  More than a month ago    Pain Frequency  Intermittent    Aggravating Factors   sleep at night, random    Pain Relieving Factors  medication, change of position                       [facility] Adult PT Treatment/Exercise - 06/15/19 0001      Exercises   Exercises  Shoulder;Neck      Shoulder Exercises: Supine   Horizontal ABduction   Strengthening;Both;20 reps;Theraband    Theraband Level (Shoulder Horizontal ABduction)  Level 1 (Yellow)    External Rotation  Strengthening;Both;20 reps;Theraband    Theraband Level (Shoulder External Rotation)  Level 1 (Yellow)    Diagonals  Strengthening;Both;20 reps;Theraband    Theraband Level (Shoulder Diagonals)  Level 1 (Yellow)      Shoulder Exercises: ROM/Strengthening   UBE (Upper Arm Bike)  Level 1x  6 minutes (3/3)   PT present to discuss progress and provide postural cueing     Traction   Type of Traction  Cervical    Min (lbs)  5    Max (lbs)  15    Hold Time  60    Rest Time  10    Time  10      Manual Therapy   Manual Therapy  Soft tissue mobilization;Myofascial release    Manual therapy comments  soft tissue elongation to Rt rhomboids        Trigger Point Dry Needling - 06/15/19 0001    Consent Given?  Yes    Muscles Treated Upper Quadrant  Rhomboids   Rt only   Rhomboids  Response  Twitch response elicited;Palpable increased muscle length           PT Education - 06/15/19 0743    Education Details  Access Code: [email or web address]    Person(s) Educated  Patient    Methods  Explanation;Demonstration;Handout    Comprehension  Verbalized understanding;Returned demonstration       PT Short Term Goals - 06/13/19 0855      PT SHORT TERM GOAL #2   Title  able to look up with 25% less pain in cervical due to improved mobility    Status  Achieved      PT SHORT TERM GOAL #3   Title  full right shoulder flexion and abduction A/AROM due to decreased pain and reduction of tissue tightness    Status  Achieved      PT SHORT TERM GOAL #4   Title  sleep has improved >/= 25% due to reduction of pain    Status  Achieved        PT Long Term Goals - 06/13/19 0856      PT LONG TERM GOAL #2   Title  able to turn her head to look behind her without difficulty due to full cervical rotation    Time  6    Period  Weeks    Status  On-going            Plan -  06/15/19 0747    Clinical Impression Statement  Pt is making good progress with significant reduction in Rt UE and scapular pain overall.  Pt had increased pain last night in the scapular region and PT performed dry needling to address periscapular musculature.  Pt tolerated arm bike and advancement of HEP for scapular strength without increased pain today.  Pt continues to demonstrate slumped seated posture and is making postural corrections at home.  Pt with improved tissue mobility and reduced Rt scapular pain after traction and manual therapy today.  Pt will continue to benefit from skilled PT to address Rt UE radiculopathy.    PT Frequency  2x / week    PT Duration  8 weeks    PT Treatment/Interventions  Cryotherapy;Electrical Stimulation;Moist Heat;Traction;Ultrasound;Therapeutic exercise;Neuromuscular re-education;Therapeutic activities;Patient/family education;Dry needling;Passive range of motion;Manual techniques;Spinal Manipulations    PT Next Visit Plan  continue traction and dry needling, review new HEP    PT Home Exercise Plan  Access Code: [email or web address]    Consulted and Agree with Plan of Care  Patient       Patient will benefit from skilled therapeutic intervention in order to improve the following deficits and impairments:  Decreased range of motion, Increased fascial restricitons, Increased muscle spasms, Pain, Decreased activity tolerance, Impaired flexibility, Decreased mobility, Decreased strength, Postural dysfunction  Visit Diagnosis: Acute pain of right shoulder  Cervicalgia  Muscle weakness (generalized)     Problem List Patient Active Problem List   Diagnosis Date Noted  . Palpitations 01/09/2017  . Syncope and collapse 01/09/2017  . Migraine 08/11/2016  . Left breast mass 02/03/2016  . Asthma with acute exacerbation 01/21/2016  . Vulvar abscess 10/30/2015  . Allergic rhinitis 10/21/2015  . Lesion of oral mucosa 08/26/2015    [name], PT 06/15/19 8:13  AM PHYSICAL THERAPY DISCHARGE SUMMARY  Visits from Start of Care: 5   Current functional level related to goals / functional outcomes: See above for current status.  Pt didn't return to PT.    Remaining deficits: See above for current status.     Education /  Equipment: HEP Plan: Patient agrees to discharge.  Patient goals were not met. Patient is being discharged due to not returning since the last visit.  ?????         [name], PT 08/09/19 7:59 AM  [facility] [address], [place], [place], [address] Phone: [phone]   Fax:  [phone]  Name: Samantha Hartman MRN: [id number] Date of Birth: [birth year]

## 2019-06-15 NOTE — Patient Instructions (Signed)
Access Code: [email or web address]  URL: [email or web address]  Date: 06/15/2019  Prepared by: [name]   y Supine Shoulder Horizontal Abduction with Resistance - 10 reps - 2 sets - 2x daily - 7x weekly Supine Bilateral Shoulder External Rotation with Resistance - 10 reps - 2 sets - 2x daily - 7x weekly Supine PNF D2 Flexion with Resistance - 10 reps - 2 sets - 2x daily - 7x weekly

## 2019-06-19 ENCOUNTER — Encounter: Payer: Self-pay | Admitting: Family Medicine

## 2019-06-19 DIAGNOSIS — M542 Cervicalgia: Secondary | ICD-10-CM

## 2019-06-19 DIAGNOSIS — M79601 Pain in right arm: Secondary | ICD-10-CM

## 2019-06-28 ENCOUNTER — Ambulatory Visit: Payer: BC Managed Care – PPO

## 2019-07-03 ENCOUNTER — Ambulatory Visit: Payer: BC Managed Care – PPO

## 2019-07-04 ENCOUNTER — Ambulatory Visit (INDEPENDENT_AMBULATORY_CARE_PROVIDER_SITE_OTHER): Payer: BC Managed Care – PPO | Admitting: Physical Medicine and Rehabilitation

## 2019-07-04 ENCOUNTER — Other Ambulatory Visit: Payer: Self-pay

## 2019-07-04 ENCOUNTER — Encounter: Payer: Self-pay | Admitting: Physical Medicine and Rehabilitation

## 2019-07-04 VITALS — BP 112/77 | HR 85

## 2019-07-04 DIAGNOSIS — M25512 Pain in left shoulder: Secondary | ICD-10-CM

## 2019-07-04 DIAGNOSIS — M5010 Cervical disc disorder with radiculopathy, unspecified cervical region: Secondary | ICD-10-CM

## 2019-07-04 DIAGNOSIS — M542 Cervicalgia: Secondary | ICD-10-CM | POA: Diagnosis not present

## 2019-07-04 DIAGNOSIS — M7918 Myalgia, other site: Secondary | ICD-10-CM | POA: Diagnosis not present

## 2019-07-04 DIAGNOSIS — G8929 Other chronic pain: Secondary | ICD-10-CM

## 2019-07-04 DIAGNOSIS — M25511 Pain in right shoulder: Secondary | ICD-10-CM | POA: Diagnosis not present

## 2019-07-04 DIAGNOSIS — M47812 Spondylosis without myelopathy or radiculopathy, cervical region: Secondary | ICD-10-CM

## 2019-07-04 NOTE — Progress Notes (Signed)
Samantha Hartman - 33 y.o. female MRN [id number]  Date of birth: 1986/06/28  Office Visit Note: Visit Date: 07/04/2019 PCP: Dorothyann Peng, NP Referred by: Dorothyann Peng, NP  Subjective: Chief Complaint  Patient presents with   Neck - Pain   Right Shoulder - Pain   Left Shoulder - Pain   Left Arm - Pain   Right Arm - Pain   HPI: Samantha Hartman is a 33 y.o. female who comes in today At the request of Dr. Legrand Como hilts for possible cervical spine interventional procedure and treatment.  By her history she has had some history of left-sided shoulder pain that was relieved by subacromial injection by her primary care physician on a couple of occasions.  She started seeing Dr. Junius Roads with complaint of neck pain but also right shoulder pain which was worsening since May of this year without any specific injury.  He also performed subacromial injection without much relief.  Since that time she has been in physical therapy at [facility] physical therapy with mechanical treatment as well as dry needling.  She reports that she was having neck pain referral into both trapezius and shoulders and arms to the elbow.  She denies any paresthesias numbness or tingling.  No focal weakness.  She reports her pain is worse with activity or trying to sleep.  She describes this is intermittent dull and aching.  She does endorse an average pain of 8 out of 10 pain today of 6 out of 10.  Interestingly though she tells me that the right side is doing much better.  She still has been in physical therapy.  She feels like it is helping.  She is getting some pain on the left side but not nearly as bad as the right.  The left-sided pain just started a few days ago she feels like she may have slept on that side wrong.  MRI of the cervical spine was performed because patient just was not getting any better and this is reviewed with her today with spine models and imaging.  It is also reviewed below.   Interestingly for her age she does have some slight narrowing at C4-5 with uncovertebral joint hypertrophy and right sided more than left-sided foraminal narrowing at a couple levels.  No overt nerve compression.  No changes to the cord.  Medical history complicated by migraine headache.  Review of Systems  Constitutional: Negative for chills, fever, malaise/fatigue and weight loss.  HENT: Negative for hearing loss and sinus pain.   Eyes: Negative for blurred vision, double vision and photophobia.  Respiratory: Negative for cough and shortness of breath.   Cardiovascular: Negative for chest pain, palpitations and leg swelling.  Gastrointestinal: Negative for abdominal pain, nausea and vomiting.  Genitourinary: Negative for flank pain.  Musculoskeletal: Positive for joint pain and neck pain. Negative for myalgias.  Skin: Negative for itching and rash.  Neurological: Positive for headaches. Negative for tingling, tremors, focal weakness and weakness.  Endo/Heme/Allergies: Negative.   Psychiatric/Behavioral: Negative for depression.  All other systems reviewed and are negative.  Otherwise per HPI.  Assessment & Plan: Visit Diagnoses:  1. Cervicalgia   2. Chronic pain of both shoulders   3. Cervical disc disorder with radiculopathy   4. Myofascial pain syndrome   5. Cervical spondylosis without myelopathy     Plan: Findings:  Chronic neck and shoulder pain predominantly upper arms without any referral to the hands or paresthesias since May of this year.  She actually tells me that she is doing better with physical therapy at this point and the right-sided symptoms have really diminished quite a bit.  The left-sided symptoms of just started.  We went over at length the reasoning behind doing cervical injection or epidural injection.  Went over her history was far as her imaging and even talked about what would entail if there was any surgical issue there.  She has no red flag complaints or  anything which is concerning on exam.  At this point she is going to continue with physical therapy and dry needling.  She does have significant trigger points.  Sometimes these trigger points are activated by cervical spine nerve irritation and clearly she has more right-sided findings than the left.  At this point she can call us back anytime should her progress stagnate or if things worsen I would try cervical epidural injection.  We will see her back as needed.  Should continue to follow with Dr. Prince RomeHilts.    Meds & Orders: No orders of the defined types were placed in this encounter.  No orders of the defined types were placed in this encounter.   Follow-up: No follow-ups on file.   Procedures: No procedures performed  No notes on file   Clinical History: EXAM: MRI CERVICAL SPINE WITHOUT CONTRAST  TECHNIQUE: Multiplanar, multisequence MR imaging of the cervical spine was performed. No intravenous contrast was administered.  COMPARISON:  Cervical radiographs dated 05/17/2019  FINDINGS: Alignment: Physiologic.  Vertebrae: No fracture, evidence of discitis, or bone lesion. No facet arthritis in the cervical spine.  Cord: Normal signal and morphology.  Posterior Fossa, vertebral arteries, paraspinal tissues: Negative.  Disc levels:  C2-3: Normal.  C3-4: Minimal uncinate spurs into the right neural foramen with slight right foraminal narrowing.  C4-5: Tiny central subligamentous disc protrusion. Disc osteophyte complex extends into the right neural foramen with minimal narrowing of the right neural foramen. Narrowing of the AP dimension of the spinal canal with slight indentation upon the ventral aspect of the spinal cord without myelopathy.  C5-6: Tiny central disc bulge. Disc osteophyte complex extends into the right neural foramen with moderate right foraminal stenosis. Slight narrowing of the AP dimension of the spinal canal.  C6-7: Tiny disc protrusion  just to the left of midline touching the ventral aspect of the spinal cord. No myelopathy. No foraminal stenosis.  C7-T1: Normal.  IMPRESSION: 1. Right foraminal encroachment by disc osteophyte complex at C4-5 and C5-6 which could affect the right C5 and C6 nerves respectively. 2. Small disc protrusion at C6-7 to the left of midline without focal neural impingement. 3. Slight narrowing of the AP dimension of the spinal canal at C4-5 and C5-6.   Electronically Signed   By: Francene BoyersJames  Maxwell M.D.   On: 06/08/2019 16:53   She reports that she has never smoked. She has never used smokeless tobacco. No results for input(s): HGBA1C, LABURIC in the last 8760 hours.  Objective:  VS:  HT:     WT:    BMI:      BP:112/77   HR:85bpm   TEMP: ( )   RESP:  Physical Exam Vitals signs and nursing note reviewed.  Constitutional:      General: She is not in acute distress.    Appearance: Normal appearance. She is well-developed. She is not ill-appearing.  HENT:     Head: Normocephalic and atraumatic.  Eyes:     Conjunctiva/sclera: Conjunctivae normal.     Pupils: Pupils  are equal, round, and reactive to light.  Neck:     Musculoskeletal: Neck supple. Muscular tenderness present. No neck rigidity.  Cardiovascular:     Rate and Rhythm: Normal rate.     Pulses: Normal pulses.  Pulmonary:     Effort: Pulmonary effort is normal.  Musculoskeletal:     Right lower leg: No edema.     Left lower leg: No edema.     Comments: Cervical spine exam shows some decreased range of motion and ranges of rotation she is somewhat apprehensive about full motion of the cervical spine.  She gets a lot of pain with forward flexion more than extension.  She does have pretty active trigger points in the trapezius levator scapula and rhomboids bilaterally.  She has no real shoulder impingement signs on the right or left.  She has good strength in the upper extremities.  She has a negative Hoffmann's test bilaterally.    Lymphadenopathy:     Cervical: No cervical adenopathy.  Skin:    General: Skin is warm and dry.     Findings: No erythema or rash.  Neurological:     General: No focal deficit present.     Mental Status: She is alert and oriented to person, place, and time.     Sensory: No sensory deficit.     Motor: No abnormal muscle tone.     Coordination: Coordination normal.     Gait: Gait normal.  Psychiatric:        Mood and Affect: Mood normal.        Behavior: Behavior normal.     Ortho Exam Imaging: No results found.  Past Medical/Family/Surgical/Social History: Medications & Allergies reviewed per EMR, new medications updated. Patient Active Problem List   Diagnosis Date Noted   Palpitations 01/09/2017   Syncope and collapse 01/09/2017   Migraine 08/11/2016   Left breast mass 02/03/2016   Asthma with acute exacerbation 01/21/2016   Vulvar abscess 10/30/2015   Allergic rhinitis 10/21/2015   Lesion of oral mucosa 08/26/2015   Past Medical History:  Diagnosis Date   Angio-edema    Asthma    Breast mass    GERD (gastroesophageal reflux disease)    Migraines    Wears glasses    Family History  Problem Relation Age of Onset   Stroke Mother 89   Hypertension Mother    Prostate cancer Father            Diabetes Mellitus II Father    Asthma Father    Asthma Maternal Grandfather    Breast cancer Maternal Aunt    Allergic rhinitis Neg Hx    Angioedema Neg Hx    Atopy Neg Hx    Eczema Neg Hx    Immunodeficiency Neg Hx    Urticaria Neg Hx    Past Surgical History:  Procedure Laterality Date   APPENDECTOMY  April 2016   LAPAROSCOPIC TUBAL LIGATION Bilateral 12/19/2015   Procedure: LAPAROSCOPIC BILATERAL TUBAL LIGATION;  Surgeon: Richardean Chimera, MD;  [facility];  Service: Gynecology;  Laterality: Bilateral;   Social History   Occupational History   Occupation: CMA    Employer: [employer]  Tobacco Use   Smoking  status: Never Smoker   Smokeless tobacco: Never Used  Substance and Sexual Activity   Alcohol use: No    Alcohol/week: 0.0 standard drinks   Drug use: No   Sexual activity: Yes    Birth control/protection: I.U.D.    Comment: 2014-  placement Mirena IUD

## 2019-07-04 NOTE — Progress Notes (Signed)
  Numeric Pain Rating Scale and Functional Assessment Average Pain 8 Pain Right Now 6 My pain is intermittent, dull and aching Pain is worse with: some activites and sleeping Pain improves with: therapy/exercise and medication   In the last MONTH (on 0-10 scale) has pain interfered with the following?  1. General activity like being  able to carry out your everyday physical activities such as walking, climbing stairs, carrying groceries, or moving a chair?  Rating(4)  2. Relation with others like being able to carry out your usual social activities and roles such as  activities at home, at work and in your community. Rating(4)  3. Enjoyment of life such that you have  been bothered by emotional problems such as feeling anxious, depressed or irritable?  Rating(8)

## 2019-07-29 ENCOUNTER — Other Ambulatory Visit: Payer: Self-pay | Admitting: Adult Health

## 2019-07-29 DIAGNOSIS — G43801 Other migraine, not intractable, with status migrainosus: Secondary | ICD-10-CM

## 2019-09-26 ENCOUNTER — Other Ambulatory Visit: Payer: Self-pay | Admitting: Adult Health

## 2019-09-26 DIAGNOSIS — Z760 Encounter for issue of repeat prescription: Secondary | ICD-10-CM

## 2019-09-26 DIAGNOSIS — G43711 Chronic migraine without aura, intractable, with status migrainosus: Secondary | ICD-10-CM

## 2019-09-26 NOTE — Telephone Encounter (Signed)
Please see below refill request

## 2019-09-27 MED ORDER — DIVALPROEX SODIUM ER 250 MG PO TB24: 250.0000 mg | ORAL_TABLET | Freq: Every day | ORAL | 1 refills | Status: DC

## 2019-10-08 ENCOUNTER — Encounter: Payer: Self-pay | Admitting: Adult Health

## 2019-10-08 DIAGNOSIS — Z760 Encounter for issue of repeat prescription: Secondary | ICD-10-CM

## 2019-10-09 MED ORDER — TRIAMCINOLONE ACETONIDE 0.5 % EX OINT: 1.0000 "application " | TOPICAL_OINTMENT | Freq: Two times a day (BID) | CUTANEOUS | 0 refills | Status: DC

## 2019-10-30 ENCOUNTER — Encounter: Payer: Self-pay | Admitting: Family Medicine

## 2019-10-30 MED ORDER — BACLOFEN 10 MG PO TABS: 5.0000 mg | ORAL_TABLET | Freq: Three times a day (TID) | ORAL | 1 refills | Status: DC | PRN

## 2019-11-23 ENCOUNTER — Ambulatory Visit: Payer: BC Managed Care – PPO | Attending: Family

## 2019-11-23 DIAGNOSIS — Z23 Encounter for immunization: Secondary | ICD-10-CM | POA: Insufficient documentation

## 2019-11-29 ENCOUNTER — Encounter: Payer: Self-pay | Admitting: Adult Health

## 2019-11-29 DIAGNOSIS — G43801 Other migraine, not intractable, with status migrainosus: Secondary | ICD-10-CM

## 2019-11-30 MED ORDER — RIZATRIPTAN BENZOATE 5 MG PO TABS: 5.0000 mg | ORAL_TABLET | ORAL | 3 refills | Status: DC | PRN

## 2019-12-26 ENCOUNTER — Ambulatory Visit: Payer: BC Managed Care – PPO | Attending: Family

## 2019-12-26 DIAGNOSIS — Z23 Encounter for immunization: Secondary | ICD-10-CM

## 2020-01-01 ENCOUNTER — Other Ambulatory Visit: Payer: Self-pay | Admitting: Adult Health

## 2020-01-02 MED ORDER — LEVOCETIRIZINE DIHYDROCHLORIDE 5 MG PO TABS: 5.0000 mg | ORAL_TABLET | Freq: Every evening | ORAL | 0 refills | Status: DC

## 2020-01-02 NOTE — Telephone Encounter (Signed)
Resent to the pharmacy.  Received E-scribe warning.

## 2020-01-02 NOTE — Addendum Note (Signed)
Addended by: Raj Janus T on: 01/02/2020 07:01 AM   Modules accepted: Orders

## 2020-02-02 ENCOUNTER — Other Ambulatory Visit: Payer: Self-pay | Admitting: Adult Health

## 2020-02-02 MED ORDER — ALPRAZOLAM 0.25 MG PO TABS: 0.2500 mg | ORAL_TABLET | Freq: Two times a day (BID) | ORAL | 1 refills | Status: DC | PRN

## 2020-03-17 ENCOUNTER — Other Ambulatory Visit: Payer: Self-pay | Admitting: Adult Health

## 2020-03-17 DIAGNOSIS — Z760 Encounter for issue of repeat prescription: Secondary | ICD-10-CM

## 2020-06-13 ENCOUNTER — Other Ambulatory Visit: Payer: Self-pay | Admitting: Adult Health

## 2020-06-13 MED ORDER — LEVOCETIRIZINE DIHYDROCHLORIDE 5 MG PO TABS: 5.0000 mg | ORAL_TABLET | Freq: Every evening | ORAL | 0 refills | Status: DC

## 2020-07-16 ENCOUNTER — Encounter: Payer: BC Managed Care – PPO | Admitting: Adult Health

## 2020-08-23 ENCOUNTER — Ambulatory Visit: Payer: BC Managed Care – PPO | Attending: Family

## 2020-08-23 DIAGNOSIS — Z23 Encounter for immunization: Secondary | ICD-10-CM

## 2020-09-11 ENCOUNTER — Other Ambulatory Visit: Payer: Self-pay | Admitting: Family Medicine

## 2020-10-10 ENCOUNTER — Other Ambulatory Visit: Payer: Self-pay | Admitting: Family

## 2020-10-10 MED ORDER — NITROFURANTOIN MONOHYD MACRO 100 MG PO CAPS: 100.0000 mg | ORAL_CAPSULE | Freq: Two times a day (BID) | ORAL | 0 refills | Status: DC

## 2020-10-13 ENCOUNTER — Encounter: Payer: Self-pay | Admitting: Adult Health

## 2020-10-13 ENCOUNTER — Telehealth: Payer: BC Managed Care – PPO | Admitting: Family

## 2020-10-13 DIAGNOSIS — H6690 Otitis media, unspecified, unspecified ear: Secondary | ICD-10-CM

## 2020-10-13 MED ORDER — AMOXICILLIN-POT CLAVULANATE 875-125 MG PO TABS: 1.0000 | ORAL_TABLET | Freq: Two times a day (BID) | ORAL | 0 refills | Status: DC

## 2020-10-13 NOTE — Progress Notes (Signed)
E Visit for Swimmer's Ear  We are sorry that you are not feeling well. Here is how we plan to help!  Based on what you have shared with me it looks like you have an ear infection. I have prescribed Augmentin twice a day for 7 days.    In certain cases swimmer's ear may progress to a more serious bacterial infection of the middle or inner ear.  If you have a fever 102 and up and significantly worsening symptoms, this could indicate a more serious infection moving to the middle/inner and needs face to face evaluation in an office by a provider.  Your symptoms should improve over the next 3 days and should resolve in about 7 days.  HOME CARE:   Wash your hands frequently.  Do not place the tip of the bottle on your ear or touch it with your fingers.  You can take Acetominophen 650 mg every 4-6 hours as needed for pain.  If pain is severe or moderate, you can apply a heating pad (set on low) or hot water bottle (wrapped in a towel) to outer ear for 20 minutes.  This will also increase drainage.  Avoid ear plugs  Do not use Q-tips  After showers, help the water run out by tilting your head to one side.  GET HELP RIGHT AWAY IF:   Fever is over 102.2 degrees.  You develop progressive ear pain or hearing loss.  Ear symptoms persist longer than 3 days after treatment.  MAKE SURE YOU:   Understand these instructions.  Will watch your condition.  Will get help right away if you are not doing well or get worse.  TO PREVENT SWIMMER'S EAR:  Use a bathing cap or custom fitted swim molds to keep your ears dry.  Towel off after swimming to dry your ears.  Tilt your head or pull your earlobes to allow the water to escape your ear canal.  If there is still water in your ears, consider using a hairdryer on the lowest setting.  Thank you for choosing an e-visit. Your e-visit answers were reviewed by a board certified advanced clinical practitioner to complete your personal care  plan. Depending upon the condition, your plan could have included both over the counter or prescription medications. Please review your pharmacy choice. Be sure that the pharmacy you have chosen is open so that you can pick up your prescription now.  If there is a problem you may message your provider in MyChart to have the prescription routed to another pharmacy. Your safety is important to [place]. If you have drug allergies check your prescription carefully.  For the next 24 hours, you can use MyChart to ask questions about today's visit, request a non-urgent call back, or ask for a work or school excuse from your e-visit provider. You will get an email in the next two days asking about your experience. I hope that your e-visit has been valuable and will speed your recovery.    Approximately 5 minutes was spent documenting and reviewing patient's chart.

## 2020-10-14 NOTE — Telephone Encounter (Signed)
Spoke with the pt and offered a virtual visit today with Dr Selena Batten as PCP is out of the office.  Patient stated she had a virtual visit with a provider yesterday.  Message sent to PCP.

## 2020-10-22 ENCOUNTER — Other Ambulatory Visit: Payer: Self-pay | Admitting: Family

## 2020-10-22 MED ORDER — FLUCONAZOLE 200 MG PO TABS: 200.0000 mg | ORAL_TABLET | ORAL | 0 refills | Status: DC

## 2021-01-17 ENCOUNTER — Other Ambulatory Visit: Payer: Self-pay | Admitting: Adult Health

## 2021-02-05 ENCOUNTER — Other Ambulatory Visit: Payer: Self-pay | Admitting: Family

## 2021-02-05 MED ORDER — CEPHALEXIN 500 MG PO CAPS: 500.0000 mg | ORAL_CAPSULE | Freq: Three times a day (TID) | ORAL | 0 refills | Status: DC

## 2021-02-05 MED ORDER — LEVOCETIRIZINE DIHYDROCHLORIDE 5 MG PO TABS
5.0000 mg | ORAL_TABLET | Freq: Every evening | ORAL | 1 refills | Status: DC
Start: 2021-02-05 — End: 2021-08-23

## 2021-06-17 NOTE — H&P (Signed)
Patient name Samantha Hartman DICTATION# [id number] CSN# [id number]  Richardean Chimera, MD 06/17/2021 5:16 AM

## 2021-06-19 NOTE — H&P (Signed)
NAME: Samantha Hartman, Rumore MEDICAL RECORD NO: [id number] ACCOUNT NO: [id number] DATE OF BIRTH: 04-Jul-1986 PHYSICIAN: Juluis Mire, MD  History and Physical   DATE OF ADMISSION: 06/24/2021  The date of her surgery is 09/13 at outpatient area at [facility].  HISTORY OF PRESENT ILLNESS:  The patient is a 35 year old gravida 3, para 2 female who presents for diagnostic laparoscopy.  She has had a previous bilateral tubal ligation.  She is having some menstrual irregularities.  Her biggest problem, she is  having pain with intercourse during deep penetration.  Pelvic ultrasound was unremarkable.  We tried her on a course of antibiotics, which really had not helped.  We were presumptively dealing with endometriosis and/or adenomyosis.  The patient now  presents for laparoscopic evaluation.  ALLERGIES:  SHE IS ALLERGIC TO HYDROCODONE.  PAST MEDICAL HISTORY:  She has had usual childhood diseases without any significant sequelae.  PAST SURGICAL HISTORY:  She has had an appendectomy and bilateral tubal ligation.  SOCIAL HISTORY:  Reveals no tobacco or alcohol use.  FAMILY HISTORY:  Noncontributory.  PHYSICAL EXAMINATION: VITAL SIGNS:  The patient is afebrile with stable vital signs. HEENT:  The patient is normocephalic.  Pupils equal, round, reactive to light and accommodation.  Extraocular movements were intact.  Sclerae and conjunctivae were clear.  Oropharynx clear. NECK:  Without thyromegaly. BREASTS:  No discrete masses. LUNGS:  Clear. CARDIOVASCULAR:  Regular rhythm and rate without murmurs or gallops.  There is no carotid or abdominal bruits. ABDOMEN:  Benign.  No masses, organomegaly, or tenderness. PELVIC: Normal external genitalia.  Vaginal mucosa was clear.  Cervix unremarkable.  Uterus normal size, shape, and contour.  Adnexa free of masses or tenderness. EXTREMITIES:  Trace edema. NEUROLOGIC:  Grossly within normal limits.  IMPRESSION:  Dyspareunia, possible pelvic  endometriosis.  PLAN:  The patient will undergo diagnostic laparoscopy for evaluation and treatment.  The nature of the procedure have been discussed.  The risks explained including the risk of infection, the risk of hemorrhage that could require transfusion with the  risk of AIDS or hepatitis, risk of injury to adjacent organs such as bladder, bowel, or ureters that could require further exploratory surgery, the risk of deep venous thrombosis and pulmonary embolus.  The patient expressed understanding of the  indications and risks.   SHW D: 06/17/2021 5:15:13 am T: 06/17/2021 8:00:00 am  JOB: [id number]/ [id number]

## 2021-06-20 ENCOUNTER — Other Ambulatory Visit: Payer: Self-pay

## 2021-06-20 ENCOUNTER — Encounter (HOSPITAL_BASED_OUTPATIENT_CLINIC_OR_DEPARTMENT_OTHER): Payer: Self-pay | Admitting: Obstetrics and Gynecology

## 2021-06-20 NOTE — Progress Notes (Signed)
Spoke w/ via phone for pre-op interview---pt Lab needs dos---- serum Hcg, CBC, T& S             Lab results------N/A COVID test -----patient states asymptomatic no test needed Arrive at -------0530, 06/24/2021 NPO after MN NO Solid Food.  Clear liquids from MN until---06/24/2021, 0430 Med rec completed Medications to take morning of surgery -----Proventil, Diabetic medication -----n/a Patient instructed no nail polish to be worn day of surgery Patient instructed to bring photo id and insurance card day of surgery Patient aware to have Driver (ride ) / sister in Social worker or fiance caregiver    for 24 hours after surgery  Patient Special Instructions -----n/a Pre-Op special Istructions -----n/a Patient verbalized understanding of instructions that were given at this phone interview. Patient denies shortness of breath, chest pain, fever, cough at this phone interview.

## 2021-06-23 NOTE — Anesthesia Preprocedure Evaluation (Addendum)
Anesthesia Evaluation  Patient identified by MRN, date of birth, ID band Patient awake    Reviewed: Allergy & Precautions, NPO status , Patient's Chart, lab work & pertinent test results  Airway Mallampati: III  TM Distance: >3 FB Neck ROM: Full    Dental no notable dental hx. (+) Teeth Intact, Dental Advisory Given   Pulmonary asthma (well controlled, uses inhaler 1-2x/yr for seasonal allergies) ,    Pulmonary exam normal breath sounds clear to auscultation       Cardiovascular Normal cardiovascular exam Rhythm:Regular Rate:Normal  Echo 2018: - Left ventricle: The cavity size was normal. Wall thickness was  normal. Systolic function was normal. The estimated ejection  fraction was in the range of 60% to 65%. Normal GLPSS at -21%.  Wall motion was normal; there were no regional wall motion  abnormalities. Left ventricular diastolic function parameters  were normal.  - Mitral valve: Mildly thickened leaflets, no prolapse. There was  trivial regurgitation.  - Left atrium: The atrium was normal in size.  - Inferior vena cava: The vessel was normal in size. The  respirophasic diameter changes were in the normal range (>= 50%),  consistent with normal central venous pressure.    Neuro/Psych  Headaches, negative psych ROS   GI/Hepatic Neg liver ROS, GERD  Controlled,  Endo/Other  Obesity BMI 34  Renal/GU negative Renal ROS  Female GU complaint Pelvic pain    Musculoskeletal negative musculoskeletal ROS (+)   Abdominal (+) + obese,   Peds  Hematology negative hematology ROS (+)   Anesthesia Other Findings   Reproductive/Obstetrics negative OB ROS                            Anesthesia Physical Anesthesia Plan  ASA: 2  Anesthesia Plan: General   Post-op Pain Management:    Induction: Intravenous  PONV Risk Score and Plan: 4 or greater and Ondansetron, Dexamethasone,  Midazolam, Scopolamine patch - Pre-op and Treatment may vary due to age or medical condition  Airway Management Planned: Oral ETT  Additional Equipment: None  Intra-op Plan:   Post-operative Plan: Extubation in OR  Informed Consent: I have reviewed the patients History and Physical, chart, labs and discussed the procedure including the risks, benefits and alternatives for the proposed anesthesia with the patient or authorized representative who has indicated his/her understanding and acceptance.     Dental advisory given  Plan Discussed with: CRNA  Anesthesia Plan Comments: (Itching w/ percocet, has done well w/ norco in the past )       Anesthesia Quick Evaluation

## 2021-06-24 ENCOUNTER — Other Ambulatory Visit: Payer: Self-pay

## 2021-06-24 ENCOUNTER — Ambulatory Visit (HOSPITAL_BASED_OUTPATIENT_CLINIC_OR_DEPARTMENT_OTHER): Payer: BC Managed Care – PPO | Admitting: Anesthesiology

## 2021-06-24 ENCOUNTER — Encounter (HOSPITAL_BASED_OUTPATIENT_CLINIC_OR_DEPARTMENT_OTHER)
Admission: RE | Disposition: A | Discharge status: Home / Self Care | Payer: Self-pay | Source: Ambulatory Visit | Attending: Obstetrics and Gynecology

## 2021-06-24 ENCOUNTER — Encounter (HOSPITAL_BASED_OUTPATIENT_CLINIC_OR_DEPARTMENT_OTHER): Payer: Self-pay | Admitting: Obstetrics and Gynecology

## 2021-06-24 ENCOUNTER — Ambulatory Visit (HOSPITAL_BASED_OUTPATIENT_CLINIC_OR_DEPARTMENT_OTHER)
Admission: RE | Admit: 2021-06-24 | Discharge: 2021-06-24 | Disposition: A | Discharge status: Home / Self Care | Payer: BC Managed Care – PPO | Source: Ambulatory Visit | Attending: Obstetrics and Gynecology | Admitting: Obstetrics and Gynecology

## 2021-06-24 DIAGNOSIS — Z885 Allergy status to narcotic agent status: Secondary | ICD-10-CM | POA: Diagnosis not present

## 2021-06-24 DIAGNOSIS — Z9049 Acquired absence of other specified parts of digestive tract: Secondary | ICD-10-CM | POA: Insufficient documentation

## 2021-06-24 DIAGNOSIS — N941 Unspecified dyspareunia: Secondary | ICD-10-CM | POA: Insufficient documentation

## 2021-06-24 DIAGNOSIS — N803 Endometriosis of pelvic peritoneum: Secondary | ICD-10-CM | POA: Insufficient documentation

## 2021-06-24 DIAGNOSIS — Z9851 Tubal ligation status: Secondary | ICD-10-CM | POA: Insufficient documentation

## 2021-06-24 HISTORY — DX: COVID-19: U07.1

## 2021-06-24 HISTORY — PX: LAPAROSCOPY: SHX197

## 2021-06-24 HISTORY — DX: Cardiac murmur, unspecified: R01.1

## 2021-06-24 LAB — TYPE AND SCREEN
ABO/RH(D): A POS
Antibody Screen: NEGATIVE

## 2021-06-24 LAB — CBC
HCT: 39.3 % (ref 36.0–46.0)
Hemoglobin: 13.1 g/dL (ref 12.0–15.0)
MCH: 30.6 pg (ref 26.0–34.0)
MCHC: 33.3 g/dL (ref 30.0–36.0)
MCV: 91.8 fL (ref 80.0–100.0)
Platelets: 238 10*3/uL (ref 150–400)
RBC: 4.28 MIL/uL (ref 3.87–5.11)
RDW: 12.5 % (ref 11.5–15.5)
WBC: 6.5 10*3/uL (ref 4.0–10.5)
nRBC: 0 % (ref 0.0–0.2)

## 2021-06-24 LAB — ABO/RH: ABO/RH(D): A POS

## 2021-06-24 LAB — HCG, SERUM, QUALITATIVE: Preg, Serum: NEGATIVE

## 2021-06-24 SURGERY — LAPAROSCOPY, DIAGNOSTIC
Anesthesia: General | Site: Abdomen

## 2021-06-24 MED ORDER — MEPERIDINE HCL 25 MG/ML IJ SOLN: 6.2500 mg | INTRAMUSCULAR | Status: DC | PRN

## 2021-06-24 MED ORDER — KETOROLAC TROMETHAMINE 30 MG/ML IJ SOLN
INTRAMUSCULAR | Status: AC
  Filled 2021-06-24: qty 1

## 2021-06-24 MED ORDER — MIDAZOLAM HCL 2 MG/2ML IJ SOLN
INTRAMUSCULAR | Status: AC
  Filled 2021-06-24: qty 2

## 2021-06-24 MED ORDER — LACTATED RINGERS IV SOLN: INTRAVENOUS | Status: DC

## 2021-06-24 MED ORDER — DEXAMETHASONE SODIUM PHOSPHATE 10 MG/ML IJ SOLN
INTRAMUSCULAR | Status: AC
  Filled 2021-06-24: qty 1

## 2021-06-24 MED ORDER — ACETAMINOPHEN 500 MG PO TABS
1000.0000 mg | ORAL_TABLET | Freq: Once | ORAL | Status: AC
  Administered 2021-06-24: 1000 mg via ORAL

## 2021-06-24 MED ORDER — BUPIVACAINE HCL 0.25 % IJ SOLN
INTRAMUSCULAR | Status: DC | PRN
  Administered 2021-06-24: 8 mL

## 2021-06-24 MED ORDER — ROCURONIUM BROMIDE 10 MG/ML (PF) SYRINGE
PREFILLED_SYRINGE | INTRAVENOUS | Status: AC
  Filled 2021-06-24: qty 10

## 2021-06-24 MED ORDER — LIDOCAINE HCL (CARDIAC) PF 100 MG/5ML IV SOSY
PREFILLED_SYRINGE | INTRAVENOUS | Status: DC | PRN
  Administered 2021-06-24: 60 mg via INTRAVENOUS

## 2021-06-24 MED ORDER — FENTANYL CITRATE (PF) 100 MCG/2ML IJ SOLN
INTRAMUSCULAR | Status: AC
  Filled 2021-06-24: qty 2

## 2021-06-24 MED ORDER — DEXAMETHASONE SODIUM PHOSPHATE 4 MG/ML IJ SOLN
INTRAMUSCULAR | Status: DC | PRN
  Administered 2021-06-24: 8 mg via INTRAVENOUS

## 2021-06-24 MED ORDER — PROMETHAZINE HCL 25 MG/ML IJ SOLN: 6.2500 mg | INTRAMUSCULAR | Status: DC | PRN

## 2021-06-24 MED ORDER — SCOPOLAMINE 1 MG/3DAYS TD PT72
1.0000 | MEDICATED_PATCH | TRANSDERMAL | Status: DC
  Administered 2021-06-24: 1.5 mg via TRANSDERMAL

## 2021-06-24 MED ORDER — LIDOCAINE 2% (20 MG/ML) 5 ML SYRINGE
INTRAMUSCULAR | Status: AC
  Filled 2021-06-24: qty 5

## 2021-06-24 MED ORDER — KETOROLAC TROMETHAMINE 30 MG/ML IJ SOLN: 30.0000 mg | Freq: Once | INTRAMUSCULAR | Status: DC | PRN

## 2021-06-24 MED ORDER — ONDANSETRON HCL 4 MG/2ML IJ SOLN
INTRAMUSCULAR | Status: AC
  Filled 2021-06-24: qty 2

## 2021-06-24 MED ORDER — ROCURONIUM BROMIDE 100 MG/10ML IV SOLN
INTRAVENOUS | Status: DC | PRN
  Administered 2021-06-24: 80 mg via INTRAVENOUS

## 2021-06-24 MED ORDER — FENTANYL CITRATE (PF) 100 MCG/2ML IJ SOLN
INTRAMUSCULAR | Status: DC | PRN
  Administered 2021-06-24 (×2): 50 ug via INTRAVENOUS

## 2021-06-24 MED ORDER — SUGAMMADEX SODIUM 200 MG/2ML IV SOLN
INTRAVENOUS | Status: DC | PRN
  Administered 2021-06-24: 200 mg via INTRAVENOUS

## 2021-06-24 MED ORDER — MIDAZOLAM HCL 5 MG/5ML IJ SOLN
INTRAMUSCULAR | Status: DC | PRN
  Administered 2021-06-24: 2 mg via INTRAVENOUS

## 2021-06-24 MED ORDER — SCOPOLAMINE 1 MG/3DAYS TD PT72
MEDICATED_PATCH | TRANSDERMAL | Status: AC
  Filled 2021-06-24: qty 1

## 2021-06-24 MED ORDER — ONDANSETRON HCL 4 MG/2ML IJ SOLN
INTRAMUSCULAR | Status: DC | PRN
  Administered 2021-06-24: 4 mg via INTRAVENOUS

## 2021-06-24 MED ORDER — ACETAMINOPHEN 500 MG PO TABS
ORAL_TABLET | ORAL | Status: AC
  Filled 2021-06-24: qty 2

## 2021-06-24 MED ORDER — PROPOFOL 10 MG/ML IV BOLUS
INTRAVENOUS | Status: DC | PRN
Start: 2021-06-24 — End: 2021-06-24
  Administered 2021-06-24: 200 mg via INTRAVENOUS

## 2021-06-24 MED ORDER — POVIDONE-IODINE 10 % EX SWAB: 2.0000 "application " | Freq: Once | CUTANEOUS | Status: DC

## 2021-06-24 MED ORDER — CEFAZOLIN SODIUM-DEXTROSE 2-4 GM/100ML-% IV SOLN
INTRAVENOUS | Status: AC
  Filled 2021-06-24: qty 100

## 2021-06-24 MED ORDER — CEFAZOLIN SODIUM-DEXTROSE 2-4 GM/100ML-% IV SOLN
2.0000 g | INTRAVENOUS | Status: AC
  Administered 2021-06-24: 2 g via INTRAVENOUS

## 2021-06-24 MED ORDER — HYDROMORPHONE HCL 1 MG/ML IJ SOLN: 0.2500 mg | INTRAMUSCULAR | Status: DC | PRN

## 2021-06-24 MED ORDER — KETOROLAC TROMETHAMINE 30 MG/ML IJ SOLN
INTRAMUSCULAR | Status: DC | PRN
  Administered 2021-06-24: 30 mg via INTRAVENOUS

## 2021-06-24 MED ORDER — DEXMEDETOMIDINE (PRECEDEX) IN NS 20 MCG/5ML (4 MCG/ML) IV SYRINGE
PREFILLED_SYRINGE | INTRAVENOUS | Status: AC
  Filled 2021-06-24: qty 5

## 2021-06-24 MED ORDER — PROPOFOL 10 MG/ML IV BOLUS
INTRAVENOUS | Status: AC
  Filled 2021-06-24: qty 20

## 2021-06-24 MED ORDER — HYDROCODONE-ACETAMINOPHEN 7.5-325 MG PO TABS: 1.0000 | ORAL_TABLET | Freq: Once | ORAL | Status: DC | PRN

## 2021-06-24 SURGICAL SUPPLY — 41 items
ADH SKN CLS APL DERMABOND .7 (GAUZE/BANDAGES/DRESSINGS) ×1
APL SWBSTK 6 STRL LF DISP (MISCELLANEOUS)
APPLICATOR COTTON TIP 6 STRL (MISCELLANEOUS) IMPLANT
APPLICATOR COTTON TIP 6IN STRL (MISCELLANEOUS) IMPLANT
BAG RETRIEVAL 10 (BASKET)
CANISTER SUCT 1200ML W/VALVE (MISCELLANEOUS) IMPLANT
CATH ROBINSON RED A/P 16FR (CATHETERS) ×2 IMPLANT
COVER MAYO STAND STRL (DRAPES) ×2 IMPLANT
DERMABOND ADVANCED (GAUZE/BANDAGES/DRESSINGS) ×1
DERMABOND ADVANCED .7 DNX12 (GAUZE/BANDAGES/DRESSINGS) ×1 IMPLANT
DRSG COVADERM PLUS 2X2 (GAUZE/BANDAGES/DRESSINGS) IMPLANT
DURAPREP 26ML APPLICATOR (WOUND CARE) ×2 IMPLANT
GAUZE 4X4 16PLY ~~LOC~~+RFID DBL (SPONGE) ×4 IMPLANT
GLOVE SURG ENC MOIS LTX SZ7 (GLOVE) ×4 IMPLANT
GOWN STRL REUS W/TWL XL LVL3 (GOWN DISPOSABLE) ×2 IMPLANT
KIT TURNOVER CYSTO (KITS) ×2 IMPLANT
NEEDLE INSUFFLATION 120MM (ENDOMECHANICALS) IMPLANT
NS IRRIG 500ML POUR BTL (IV SOLUTION) ×2 IMPLANT
PACK LAPAROSCOPY BASIN (CUSTOM PROCEDURE TRAY) ×2 IMPLANT
PACK TRENDGUARD 450 HYBRID PRO (MISCELLANEOUS) ×1 IMPLANT
PAD OB MATERNITY 4.3X12.25 (PERSONAL CARE ITEMS) ×2 IMPLANT
PAD PREP 24X48 CUFFED NSTRL (MISCELLANEOUS) ×2 IMPLANT
SCISSORS LAP 5X45 EPIX DISP (ENDOMECHANICALS) IMPLANT
SEALER TISSUE G2 CVD JAW 45CM (ENDOMECHANICALS) IMPLANT
SET IRRIG Y TYPE TUR BLADDER L (SET/KITS/TRAYS/PACK) IMPLANT
SET SUCTION IRRIG HYDROSURG (IRRIGATION / IRRIGATOR) IMPLANT
SET TUBE SMOKE EVAC HIGH FLOW (TUBING) ×2 IMPLANT
SUT VIC AB 3-0 PS2 18 (SUTURE) ×2
SUT VIC AB 3-0 PS2 18XBRD (SUTURE) ×1 IMPLANT
SUT VIC AB 4-0 PS2 18 (SUTURE) IMPLANT
SUT VICRYL 0 ENDOLOOP (SUTURE) IMPLANT
SUT VICRYL 0 UR6 27IN ABS (SUTURE) ×1 IMPLANT
SYS BAG RETRIEVAL 10MM (BASKET)
SYSTEM BAG RETRIEVAL 10MM (BASKET) IMPLANT
TOWEL OR 17X26 10 PK STRL BLUE (TOWEL DISPOSABLE) ×4 IMPLANT
TRENDGUARD 450 HYBRID PRO PACK (MISCELLANEOUS) ×2
TROCAR BALLN 12MMX100 BLUNT (TROCAR) ×1 IMPLANT
TROCAR OPTI TIP 5M 100M (ENDOMECHANICALS) ×2 IMPLANT
TROCAR XCEL DIL TIP R 11M (ENDOMECHANICALS) IMPLANT
WARMER LAPAROSCOPE (MISCELLANEOUS) ×2 IMPLANT
WATER STERILE IRR 500ML POUR (IV SOLUTION) ×2 IMPLANT

## 2021-06-24 NOTE — Op Note (Signed)
NAME: Samantha Hartman, Samantha Hartman MEDICAL RECORD NO: [id number] ACCOUNT NO: [id number] DATE OF BIRTH: 02-11-1986 FACILITY: [facility] LOCATION: [facility] PHYSICIAN: Juluis Mire, MD  Operative Report   DATE OF PROCEDURE: 06/24/2021  PREOPERATIVE DIAGNOSES:  Dyspareunia and pelvic pain.  POSTOPERATIVE DIAGNOSES:  Dyspareunia and pelvic pain with findings of mild pelvic endometriosis.  PROCEDURE:  Diagnostic laparoscopy with cautery of endometriotic implants.   SURGEON:  Richardean Chimera, MD  ANESTHESIA:  General endotracheal.  ESTIMATED BLOOD LOSS:  Minimal.  PACKS AND DRAINS:  None.  INTRAOPERATIVE BLOOD REPLACEMENT:  None.  COMPLICATIONS:  None.  INDICATIONS:  Dictated in history and physical.  DESCRIPTION OF PROCEDURE:  The patient was taken to the OR and placed in supine position.  After satisfactory level of general endotracheal anesthesia was obtained, she was placed in the dorsal lithotomy position using the Allen stirrups.  Perineum and  vagina were prepped out with Betadine.  Bladder was emptied via catheterization.  A Hulka tenaculum was put in place and secured.  Abdomen was prepped with DuraPrep.  After a period of time, the patient was draped in a sterile field.  A subumbilical  incision made with a knife and extended through the subcutaneous tissue.  Fascia was identified, entered sharply.  Peritoneum was entered with blunt finger pressure.  Laparoscope was introduced.  Visualization revealed no evidence of injury to adjacent  organs.  There was no periumbilical adhesions.  A 5 mm trocar was put in place in the suprapubic area under direct visualization.  Visualization revealed the appendix to be surgically absent.  Upper abdomen including the liver tip, the gallbladder were  clear.  Both lateral gutters were clear.  Uterus was elevated.  Had no pelvic adhesions.  Had evidence of a previous bilateral tubal ligation.  Both ovaries were normal.  She had some findings of minimal pelvic  endometriosis with implants along the right  pelvic sidewall and left pelvic sidewall.  No adhesions were associated with these.  The cautery unit was brought in place.  All areas of endometriosis were adequately cauterized.  At the end of the procedure, all areas of endometriosis were cauterized.   There was no evidence of injury to adjacent organs.  The cautery unit was removed.  The 5 mm trocar was removed.  The abdomen was de-inflated with carbon dioxide.  Subumbilical trocar was then removed.  Subumbilical fascia was closed with a  figure-of-eight of 0 Vicryl.  The skin was closed with interrupted subcuticular of 4-0 Vicryl.  Suprapubic incision was closed with Dermabond.  Hulka tenaculum was removed.  The patient was taken out of dorsal lithotomy position; once alert and  extubated, transferred to recovery room in good condition.  Sponge, instrument and needle count reported as correct by circulating nurse x2.   SHW D: 06/24/2021 8:23:45 am T: 06/24/2021 9:26:00 am  JOB: [id number]/ [id number]

## 2021-06-24 NOTE — Anesthesia Postprocedure Evaluation (Signed)
Anesthesia Post Note  Patient: Samantha Hartman  Procedure(s) Performed: LAPAROSCOPY DIAGNOSTIC, FULGERATION OF ENDOMETRIOSIS (Abdomen)     Patient location during evaluation: PACU Anesthesia Type: General Level of consciousness: awake and alert, oriented and patient cooperative Pain management: pain level controlled Vital Signs Assessment: post-procedure vital signs reviewed and stable Respiratory status: spontaneous breathing, nonlabored ventilation and respiratory function stable Cardiovascular status: blood pressure returned to baseline and stable Postop Assessment: no apparent nausea or vomiting Anesthetic complications: no   No notable events documented.  Last Vitals:  Vitals:   06/24/21 0900 06/24/21 0915  BP: 128/90   Pulse: 74 69  Resp: 14 14  Temp:  (!) 36.4 C  SpO2: 99% 98%    Last Pain:  Vitals:   06/24/21 0900  TempSrc:   PainSc: 0-No pain                 Lannie Fields

## 2021-06-24 NOTE — Brief Op Note (Signed)
06/24/2021  8:24 AM  PATIENT:  Samantha Hartman  35 y.o. female  PRE-OPERATIVE DIAGNOSIS:  PELVIC PAIN  POST-OPERATIVE DIAGNOSIS:  ENDOMETRIOSIS  PROCEDURE:  Procedure(s): LAPAROSCOPY DIAGNOSTIC, FULGERATION OF ENDOMETRIOSIS (N/A)  SURGEON:  Surgeon(s) and Role:    * Richardean Chimera, MD - Primary  PHYSICIAN ASSISTANT:   ASSISTANTS: none   ANESTHESIA:   general  EBL:  5 mL   BLOOD ADMINISTERED:none  DRAINS: none   LOCAL MEDICATIONS USED:  MARCAINE     SPECIMEN:  No Specimen  DISPOSITION OF SPECIMEN:  N/A  COUNTS:  YES  TOURNIQUET:  * No tourniquets in log *  DICTATION: .Other Dictation: Dictation Number [id number]  PLAN OF CARE: Discharge to home after PACU  PATIENT DISPOSITION:  PACU - hemodynamically stable.   Delay start of Pharmacological VTE agent (>24hrs) due to surgical blood loss or risk of bleeding: not applicable

## 2021-06-24 NOTE — Discharge Instructions (Signed)
Post Anesthesia Home Care Instructions  Activity: Get plenty of rest for the remainder of the day. A responsible individual must stay with you for 24 hours following the procedure.  For the next 24 hours, DO NOT: -Drive a car -Advertising copywriter -Drink alcoholic beverages -Take any medication unless instructed by your physician -Make any legal decisions or sign important papers.  Meals: Start with liquid foods such as gelatin or soup. Progress to regular foods as tolerated. Avoid greasy, spicy, heavy foods. If nausea and/or vomiting occur, drink only clear liquids until the nausea and/or vomiting subsides. Call your physician if vomiting continues.  Special Instructions/Symptoms: Your throat may feel dry or sore from the anesthesia or the breathing tube placed in your throat during surgery. If this causes discomfort, gargle with warm salt water. The discomfort should disappear within 24 hours.  If you had a scopolamine patch placed behind your ear for the management of post- operative nausea and/or vomiting:  1. The medication in the patch is effective for 72 hours, after which it should be removed.  Wrap patch in a tissue and discard in the trash. Wash hands thoroughly with soap and water. 2. You may remove the patch earlier than 72 hours if you experience unpleasant side effects which may include dry mouth, dizziness or visual disturbances. 3. Avoid touching the patch. Wash your hands with soap and water after contact with the patch.    DISCHARGE INSTRUCTIONS: Laparoscopy  The following instructions have been prepared to help you care for yourself upon your return home today.  Wound care:  Do not get the incision wet for the first 24 hours. The incision should be kept clean and dry.  The Band-Aids or dressings may be removed the day after surgery.  Should the incision become sore, red, and swollen after the first week, check with your doctor.  Personal hygiene:  Shower the day  after your procedure.  Activity and limitations:  Do NOT drive or operate any equipment today.  Do NOT lift anything more than 15 pounds for 2-3 weeks after surgery.  Do NOT rest in bed all day.  Walking is encouraged. Walk each day, starting slowly with 5-minute walks 3 or 4 times a day. Slowly increase the length of your walks.  Walk up and down stairs slowly.  Do NOT do strenuous activities, such as golfing, playing tennis, bowling, running, biking, weight lifting, gardening, mowing, or vacuuming for 2-4 weeks. Ask your doctor when it is okay to start.  Diet: Eat a light meal as desired this evening. You may resume your usual diet tomorrow.  Return to work: This is dependent on the type of work you do. For the most part you can return to a desk job within a week of surgery. If you are more active at work, please discuss this with your doctor.  What to expect after your surgery: You may have a slight burning sensation when you urinate on the first day. You may have a very small amount of blood in the urine. Expect to have a small amount of vaginal discharge/light bleeding for 1-2 weeks. It is not unusual to have abdominal soreness and bruising for up to 2 weeks. You may be tired and need more rest for about 1 week. You may experience shoulder pain for 24-72 hours. Lying flat in bed may relieve it.  Call your doctor for any of the following:  Develop a fever of 100.4 or greater  Inability to urinate 6 hours after  discharge from hospital  Severe pain not relieved by pain medications  Persistent of heavy bleeding at incision site  Redness or swelling around incision site after a week  Increasing nausea or vomiting    May take tylenol after 1200 if needed and Ibuprofen, aleve, motrin after 2:00 pm today if needed

## 2021-06-24 NOTE — H&P (Signed)
  History and physical exam unchanged 

## 2021-06-24 NOTE — Anesthesia Procedure Notes (Signed)
Procedure Name: Intubation Date/Time: 06/24/2021 7:34 AM Performed by: Cleda Clarks, CRNA Pre-anesthesia Checklist: Patient identified, Emergency Drugs available, Suction available and Patient being monitored Patient Re-evaluated:Patient Re-evaluated prior to induction Oxygen Delivery Method: Circle system utilized Preoxygenation: Pre-oxygenation with 100% oxygen Induction Type: IV induction Ventilation: Mask ventilation without difficulty Laryngoscope Size: Miller and 2 Grade View: Grade II Tube type: Oral Tube size: 7.0 mm Number of attempts: 1 Airway Equipment and Method: Stylet and Oral airway Placement Confirmation: ETT inserted through vocal cords under direct vision, positive ETCO2 and breath sounds checked- equal and bilateral Secured at: 21 cm Tube secured with: Tape Dental Injury: Teeth and Oropharynx as per pre-operative assessment

## 2021-06-24 NOTE — Transfer of Care (Signed)
Immediate Anesthesia Transfer of Care Note  Patient: Samantha Hartman  Procedure(s) Performed: LAPAROSCOPY DIAGNOSTIC, FULGERATION OF ENDOMETRIOSIS (Abdomen)  Patient Location: PACU  Anesthesia Type:General  Level of Consciousness: awake, alert  and oriented  Airway & Oxygen Therapy: Patient Spontanous Breathing and Patient connected to nasal cannula oxygen  Post-op Assessment: Report given to RN and Post -op Vital signs reviewed and stable  Post vital signs: Reviewed and stable  Last Vitals:  Vitals Value Taken Time  BP 136/95 06/24/21 0823  Temp    Pulse 90 06/24/21 0827  Resp 10 06/24/21 0827  SpO2 100 % 06/24/21 0827  Vitals shown include unvalidated device data.  Last Pain:  Vitals:   06/24/21 5449  TempSrc: Oral  PainSc: 0-No pain      Patients Stated Pain Goal: 5 (06/24/21 2010)  Complications: No notable events documented.

## 2021-06-25 ENCOUNTER — Encounter (HOSPITAL_BASED_OUTPATIENT_CLINIC_OR_DEPARTMENT_OTHER): Payer: Self-pay | Admitting: Obstetrics and Gynecology

## 2021-08-17 ENCOUNTER — Telehealth: Payer: BC Managed Care – PPO | Admitting: Family

## 2021-08-17 DIAGNOSIS — H109 Unspecified conjunctivitis: Secondary | ICD-10-CM

## 2021-08-17 MED ORDER — POLYMYXIN B-TRIMETHOPRIM 10000-0.1 UNIT/ML-% OP SOLN: 1.0000 [drp] | Freq: Four times a day (QID) | OPHTHALMIC | 0 refills | Status: DC

## 2021-08-17 NOTE — Patient Instructions (Signed)
Bacterial Conjunctivitis, Adult °Bacterial conjunctivitis is an infection of the clear membrane that covers the white part of the eye and the inner surface of the eyelid (conjunctiva). When the blood vessels in the conjunctiva become inflamed, the eye becomes red or pink. The eye often feels irritated or itchy. Bacterial conjunctivitis spreads easily from person to person (is contagious). It also spreads easily from one eye to the other eye. °What are the causes? °This condition is caused by bacteria. You may get the infection if you come into close contact with: °A person who is infected with the bacteria. °Items that are contaminated with the bacteria, such as a face towel, contact lens solution, or eye makeup. °What increases the risk? °You are more likely to develop this condition if: °You are exposed to other people who have the infection. °You wear contact lenses. °You have a sinus infection. °You have had a recent eye injury or surgery. °You have a weak body defense system (immune system). °You have a medical condition that causes dry eyes. °What are the signs or symptoms? °Symptoms of this condition include: °Thick, yellowish discharge from the eye. This may turn into a crust on the eyelid overnight and cause your eyelids to stick together. °Tearing or watery eyes. °Itchy eyes. °Burning feeling in your eyes. °Eye redness. °Swollen eyelids. °Blurred vision. °How is this diagnosed? °This condition is diagnosed based on your symptoms and medical history. Your health care provider may also take a sample of discharge from your eye to find the cause of your infection. °How is this treated? °This condition may be treated with: °Antibiotic eye drops or ointment to clear the infection more quickly and prevent the spread of infection to others. °Antibiotic medicines taken by mouth (orally) to treat infections that do not respond to drops or ointments or that last longer than 10 days. °Cool, wet cloths (cool  compresses) placed on the eyes. °Artificial tears applied 2-6 times a day. °Follow these instructions at home: °Medicines °Take or apply your antibiotic medicine as told by your health care provider. Do not stop using the antibiotic, even if your condition improves, unless directed by your health care provider. °Take or apply over-the-counter and prescription medicines only as told by your health care provider. °Be very careful to avoid touching the edge of your eyelid with the eye-drop bottle or the ointment tube when you apply medicines to the affected eye. This will keep you from spreading the infection to your other eye or to other people. °Managing discomfort °Gently wipe away any drainage from your eye with a warm, wet washcloth or a cotton ball. °Apply a clean, cool compress to your eye for 10-20 minutes, 3-4 times a day. °General instructions °Do not wear contact lenses until the inflammation is gone and your health care provider says it is safe to wear them again. Ask your health care provider how to sterilize or replace your contact lenses before you use them again. Wear glasses until you can resume wearing contact lenses. °Avoid wearing eye makeup until the inflammation is gone. Throw away any old eye cosmetics that may be contaminated. °Change or wash your pillowcase every day. °Do not share towels or washcloths. This may spread the infection. °Wash your hands often with soap and water for at least 20 seconds and especially before touching your face or eyes. Use paper towels to dry your hands. °Avoid touching or rubbing your eyes. °Do not drive or use heavy machinery if your vision is blurred. °Contact   a health care provider if: °You have a fever. °Your symptoms do not get better after 10 days. °Get help right away if: °You have a fever and your symptoms suddenly get worse. °You have severe pain when you move your eye. °You have facial pain, redness, or swelling. °You have a sudden loss of  vision. °Summary °Bacterial conjunctivitis is an infection of the clear membrane that covers the white part of the eye and the inner surface of the eyelid (conjunctiva). °Bacterial conjunctivitis spreads easily from eye to eye and from person to person (is contagious). °Wash your hands often with soap and water for at least 20 seconds and especially before touching your face or eyes. Use paper towels to dry your hands. °Take or apply your antibiotic medicine as told by your health care provider. Do not stop using the antibiotic even if your condition improves. °Contact a health care provider if you have a fever or if your symptoms do not get better after 10 days. Get help right away if you have a sudden loss of vision. °This information is not intended to replace advice given to you by your health care provider. Make sure you discuss any questions you have with your health care provider. °Document Revised: 01/08/2021 Document Reviewed: 01/08/2021 °Elsevier Patient Education © 2022 [employer] ° °

## 2021-08-17 NOTE — Progress Notes (Signed)
Virtual Visit Consent   Samantha Hartman, you are scheduled for a virtual visit with a [facility] provider today.     Just as with appointments in the office, your consent must be obtained to participate.  Your consent will be active for this visit and any virtual visit you may have with one of our providers in the next 365 days.     If you have a MyChart account, a copy of this consent can be sent to you electronically.  All virtual visits are billed to your insurance company just like a traditional visit in the office.    As this is a virtual visit, video technology does not allow for your provider to perform a traditional examination.  This may limit your provider's ability to fully assess your condition.  If your provider identifies any concerns that need to be evaluated in person or the need to arrange testing (such as labs, EKG, etc.), we will make arrangements to do so.     Although advances in technology are sophisticated, we cannot ensure that it will always work on either your end or our end.  If the connection with a video visit is poor, the visit may have to be switched to a telephone visit.  With either a video or telephone visit, we are not always able to ensure that we have a secure connection.     I need to obtain your verbal consent now.   Are you willing to proceed with your visit today?    Samantha Hartman has provided verbal consent on 08/17/2021 for a virtual visit (video or telephone).   Jannifer Rodney, FNP   Date: 08/17/2021 8:44 AM   Virtual Visit via Video Note   I, Jannifer Rodney, connected with  Samantha Hartman  ([id number], 1986-04-20) on 08/17/21 at  8:45 AM EST by a video-enabled telemedicine application and verified that I am speaking with the correct person using two identifiers.  Location: Patient: Virtual Visit Location Patient: Home Provider: Virtual Visit Location Provider: Home   I discussed the limitations of evaluation and management by telemedicine  and the availability of in person appointments. The patient expressed understanding and agreed to proceed.    History of Present Illness: Samantha Hartman is a 35 y.o. who identifies as a female who was assigned female at birth, and is being seen today for right eye redness. She reports both eyes are red, but her right eye is worse  HPI: Conjunctivitis  The current episode started 3 to 5 days ago. The onset was gradual. Associated symptoms include eye itching, headaches, eye discharge and eye redness. Pertinent negatives include no decreased vision, no double vision, no photophobia, no congestion, no ear discharge, no hearing loss and no eye pain. The eye pain is moderate. The right eye is affected.   Problems:  Patient Active Problem List   Diagnosis Date Noted   Palpitations 01/09/2017   Syncope and collapse 01/09/2017   Migraine 08/11/2016   Left breast mass 02/03/2016   Asthma with acute exacerbation 01/21/2016   Vulvar abscess 10/30/2015   Allergic rhinitis 10/21/2015   Lesion of oral mucosa 08/26/2015    Allergies:  Allergies  Allergen Reactions   Other Itching and Swelling    APPLES, PINEAPPLE, KIWI , CHERRIES Cause itchy throat and mouth swells   Percocet [Oxycodone-Acetaminophen] Itching   Medications:  Current Outpatient Medications:    trimethoprim-polymyxin b (POLYTRIM) ophthalmic solution, Place 1 drop into the left eye every 6 (  six) hours., Disp: 10 mL, Rfl: 0   albuterol (PROVENTIL HFA;VENTOLIN HFA) 108 (90 Base) MCG/ACT inhaler, Inhale 2 puffs into the lungs every 6 (six) hours as needed for wheezing or shortness of breath., Disp: 1 Inhaler, Rfl: 5   Azelastine-Fluticasone (DYMISTA) 137-50 MCG/ACT SUSP, Place 1 spray into both nostrils 2 (two) times daily., Disp: 1 Bottle, Rfl: 5   levocetirizine (XYZAL) 5 MG tablet, Take 1 tablet (5 mg total) by mouth every evening., Disp: 90 tablet, Rfl: 1   nabumetone (RELAFEN) 750 MG tablet, Take 1 tablet (750 mg total) by mouth 2  (two) times daily as needed., Disp: 60 tablet, Rfl: 6   naproxen (NAPROSYN) 500 MG tablet, Take 1 tablet (500 mg total) by mouth every 12 (twelve) hours as needed., Disp: 30 tablet, Rfl: 3   rizatriptan (MAXALT) 5 MG tablet, Take 1 tablet (5 mg total) by mouth as needed for migraine. May repeat in 2 hours if needed, Disp: 10 tablet, Rfl: 3   triamcinolone ointment (KENALOG) 0.5 %, APPLY  OINTMENT  TOPICALLY TWICE DAILY, Disp: 45 g, Rfl: 0  Observations/Objective: Patient is well-developed, well-nourished in no acute distress.  Resting comfortably  at home.  Head is normocephalic, atraumatic.  No labored breathing.  Speech is clear and coherent with logical content.  Patient is alert and oriented at baseline.  Right eye mildly erythemas   Assessment and Plan: 1. Bacterial conjunctivitis - trimethoprim-polymyxin b (POLYTRIM) ophthalmic solution; Place 1 drop into the left eye every 6 (six) hours.  Dispense: 10 mL; Refill: 0 Do not rub eye Good hand hygiene  Start using eye drops on left eye if redness or discharge worsens  Follow Up Instructions: I discussed the assessment and treatment plan with the patient. The patient was provided an opportunity to ask questions and all were answered. The patient agreed with the plan and demonstrated an understanding of the instructions.  A copy of instructions were sent to the patient via MyChart unless otherwise noted below.     The patient was advised to call back or seek an in-person evaluation if the symptoms worsen or if the condition fails to improve as anticipated.  Time:  I spent 6 minutes with the patient via telehealth technology discussing the above problems/concerns.    Jannifer Rodney, FNP

## 2021-08-21 ENCOUNTER — Other Ambulatory Visit: Payer: Self-pay

## 2021-08-22 ENCOUNTER — Telehealth: Payer: Self-pay | Admitting: Medical

## 2021-08-22 ENCOUNTER — Ambulatory Visit: Payer: BC Managed Care – PPO | Admitting: Medical

## 2021-08-22 NOTE — Telephone Encounter (Signed)
This pt did not show. Will you make sure she get charge no show fee.

## 2021-08-22 NOTE — Telephone Encounter (Signed)
Yes, we will.

## 2021-08-23 ENCOUNTER — Telehealth: Payer: BC Managed Care – PPO | Admitting: Nurse Practitioner

## 2021-08-23 DIAGNOSIS — H109 Unspecified conjunctivitis: Secondary | ICD-10-CM

## 2021-08-23 DIAGNOSIS — H1013 Acute atopic conjunctivitis, bilateral: Secondary | ICD-10-CM | POA: Diagnosis not present

## 2021-08-23 MED ORDER — OLOPATADINE HCL 0.2 % OP SOLN: OPHTHALMIC | 0 refills | Status: DC

## 2021-08-23 MED ORDER — LEVOCETIRIZINE DIHYDROCHLORIDE 5 MG PO TABS: 5.0000 mg | ORAL_TABLET | Freq: Every evening | ORAL | 1 refills | Status: DC

## 2021-08-23 NOTE — Progress Notes (Signed)
Virtual Visit Consent   Samantha Hartman, you are scheduled for a virtual visit with a [facility] provider today.     Just as with appointments in the office, your consent must be obtained to participate.  Your consent will be active for this visit and any virtual visit you may have with one of our providers in the next 365 days.     If you have a MyChart account, a copy of this consent can be sent to you electronically.  All virtual visits are billed to your insurance company just like a traditional visit in the office.    As this is a virtual visit, video technology does not allow for your provider to perform a traditional examination.  This may limit your provider's ability to fully assess your condition.  If your provider identifies any concerns that need to be evaluated in person or the need to arrange testing (such as labs, EKG, etc.), we will make arrangements to do so.     Although advances in technology are sophisticated, we cannot ensure that it will always work on either your end or our end.  If the connection with a video visit is poor, the visit may have to be switched to a telephone visit.  With either a video or telephone visit, we are not always able to ensure that we have a secure connection.     I need to obtain your verbal consent now.   Are you willing to proceed with your visit today?    Samantha Hartman has provided verbal consent on 08/23/2021 for a virtual visit (video or telephone).   Claiborne Rigg, NP   Date: 08/23/2021 9:16 AM   Virtual Visit via Video Note   I, Claiborne Rigg, connected with  Samantha Hartman  ([id number], 34-Apr-1987) on 08/23/21 at  9:30 AM EST by a video-enabled telemedicine application and verified that I am speaking with the correct person using two identifiers.  Location: Patient: Virtual Visit Location Patient: Home Provider: Virtual Visit Location Provider: Home Office   I discussed the limitations of evaluation and management by  telemedicine and the availability of in person appointments. The patient expressed understanding and agreed to proceed.    History of Present Illness: Samantha Hartman is a 35 y.o. who identifies as a female who was assigned female at birth, and is being seen today for allergic conjunctivitis of bilateral eyes.  HPI:  She had a video visit on 08/17/2021 and at that time was treated for bacterial conjunctivitis.  Today she notes increased irritation in eyes each time she applies the eyedrops and states she had relief of symptoms with taking Pataday in the past.  It appears she likely has allergic conjunctivitis versus bacterial conjunctivitis.  On video visit today both eyes are swollen however there are no signs of bacterial conjunctivitis.  She also has a history of seasonal allergies and takes Xyzal for this   Problems:  Patient Active Problem List   Diagnosis Date Noted   Palpitations 01/09/2017   Syncope and collapse 01/09/2017   Migraine 08/11/2016   Left breast mass 02/03/2016   Asthma with acute exacerbation 01/21/2016   Vulvar abscess 10/30/2015   Allergic rhinitis 10/21/2015   Lesion of oral mucosa 08/26/2015    Allergies:  Allergies  Allergen Reactions   Other Itching and Swelling    APPLES, PINEAPPLE, KIWI , CHERRIES Cause itchy throat and mouth swells   Percocet [Oxycodone-Acetaminophen] Itching   Medications:  Current Outpatient Medications:    Olopatadine HCl 0.2 % SOLN, Apply 1 drop to affected eyes every 4-6 hours as needed, Disp: 5 mL, Rfl: 0   albuterol (PROVENTIL HFA;VENTOLIN HFA) 108 (90 Base) MCG/ACT inhaler, Inhale 2 puffs into the lungs every 6 (six) hours as needed for wheezing or shortness of breath., Disp: 1 Inhaler, Rfl: 5   Azelastine-Fluticasone (DYMISTA) 137-50 MCG/ACT SUSP, Place 1 spray into both nostrils 2 (two) times daily., Disp: 1 Bottle, Rfl: 5   levocetirizine (XYZAL) 5 MG tablet, Take 1 tablet (5 mg total) by mouth every evening., Disp: 90  tablet, Rfl: 1   nabumetone (RELAFEN) 750 MG tablet, Take 1 tablet (750 mg total) by mouth 2 (two) times daily as needed., Disp: 60 tablet, Rfl: 6   naproxen (NAPROSYN) 500 MG tablet, Take 1 tablet (500 mg total) by mouth every 12 (twelve) hours as needed., Disp: 30 tablet, Rfl: 3   rizatriptan (MAXALT) 5 MG tablet, Take 1 tablet (5 mg total) by mouth as needed for migraine. May repeat in 2 hours if needed, Disp: 10 tablet, Rfl: 3   triamcinolone ointment (KENALOG) 0.5 %, APPLY  OINTMENT  TOPICALLY TWICE DAILY, Disp: 45 g, Rfl: 0   trimethoprim-polymyxin b (POLYTRIM) ophthalmic solution, Place 1 drop into the left eye every 6 (six) hours., Disp: 10 mL, Rfl: 0  Observations/Objective: Patient is well-developed, well-nourished in no acute distress.  Resting comfortably at home.  Head is normocephalic, atraumatic.  No labored breathing.  Speech is clear and coherent with logical content.  Patient is alert and oriented at baseline.    Assessment and Plan: 1. Allergic conjunctivitis of both eyes - Olopatadine HCl 0.2 % SOLN; Apply 1 drop to affected eyes every 4-6 hours as needed  Dispense: 5 mL; Refill: 0 - levocetirizine (XYZAL) 5 MG tablet; Take 1 tablet (5 mg total) by mouth every evening.  Dispense: 90 tablet; Refill: 1 Apply cold compresses to both eyes for swelling and irritation  Follow Up Instructions: I discussed the assessment and treatment plan with the patient. The patient was provided an opportunity to ask questions and all were answered. The patient agreed with the plan and demonstrated an understanding of the instructions.  A copy of instructions were sent to the patient via MyChart unless otherwise noted below.     The patient was advised to call back or seek an in-person evaluation if the symptoms worsen or if the condition fails to improve as anticipated.  Time:  I spent 10 minutes with the patient via telehealth technology discussing the above problems/concerns.     Claiborne Rigg, NP

## 2021-08-23 NOTE — Progress Notes (Signed)
Based on what you shared with me, I feel your condition warrants further evaluation and I recommend that you be seen for a face to face visit.  Please contact your primary care physician practice to be seen. Many offices offer virtual options to be seen via video if you are not comfortable going in person to a medical facility at this time.  NOTE: You will NOT be charged for this eVisit.  As you are already taking prescription eye drops and they are not working you would need to be seen at an urgent care face to face or an eye doctor's office.  If you do not have a PCP, [facility] offers a free physician referral service available at [phone]. Our trained staff has the experience, knowledge and resources to put you in touch with a physician who is right for you.    If you are having a true medical emergency please call 911.   Your e-visit answers were reviewed by a board certified advanced clinical practitioner to complete your personal care plan.  Thank you for using e-Visits.

## 2021-08-23 NOTE — Patient Instructions (Addendum)
Samantha Hartman, thank you for joining Claiborne Rigg, NP for today's virtual visit.  While this provider is not your primary care provider (PCP), if your PCP is located in our provider database this encounter information will be shared with them immediately following your visit.  Consent: (Patient) Samantha Hartman provided verbal consent for this virtual visit at the beginning of the encounter.  Current Medications:  Current Outpatient Medications:    Olopatadine HCl 0.2 % SOLN, Apply 1 drop to affected eyes every 4-6 hours as needed, Disp: 5 mL, Rfl: 0   albuterol (PROVENTIL HFA;VENTOLIN HFA) 108 (90 Base) MCG/ACT inhaler, Inhale 2 puffs into the lungs every 6 (six) hours as needed for wheezing or shortness of breath., Disp: 1 Inhaler, Rfl: 5   Azelastine-Fluticasone (DYMISTA) 137-50 MCG/ACT SUSP, Place 1 spray into both nostrils 2 (two) times daily., Disp: 1 Bottle, Rfl: 5   levocetirizine (XYZAL) 5 MG tablet, Take 1 tablet (5 mg total) by mouth every evening., Disp: 90 tablet, Rfl: 1   nabumetone (RELAFEN) 750 MG tablet, Take 1 tablet (750 mg total) by mouth 2 (two) times daily as needed., Disp: 60 tablet, Rfl: 6   naproxen (NAPROSYN) 500 MG tablet, Take 1 tablet (500 mg total) by mouth every 12 (twelve) hours as needed., Disp: 30 tablet, Rfl: 3   rizatriptan (MAXALT) 5 MG tablet, Take 1 tablet (5 mg total) by mouth as needed for migraine. May repeat in 2 hours if needed, Disp: 10 tablet, Rfl: 3   triamcinolone ointment (KENALOG) 0.5 %, APPLY  OINTMENT  TOPICALLY TWICE DAILY, Disp: 45 g, Rfl: 0   trimethoprim-polymyxin b (POLYTRIM) ophthalmic solution, Place 1 drop into the left eye every 6 (six) hours., Disp: 10 mL, Rfl: 0   Medications ordered in this encounter:  Meds ordered this encounter  Medications   Olopatadine HCl 0.2 % SOLN    Sig: Apply 1 drop to affected eyes every 4-6 hours as needed    Dispense:  5 mL    Refill:  0    Order Specific Question:   Supervising Provider     Answer:   Hyacinth Meeker, BRIAN [[id number]]   levocetirizine (XYZAL) 5 MG tablet    Sig: Take 1 tablet (5 mg total) by mouth every evening.    Dispense:  90 tablet    Refill:  1    Order Specific Question:   Supervising Provider    Answer:   Hyacinth Meeker, BRIAN [[id number]]     *If you need refills on other medications prior to your next appointment, please contact your pharmacy*  Follow-Up: Call back or seek an in-person evaluation if the symptoms worsen or if the condition fails to improve as anticipated.  Other Instructions Apply cold compresses to both eyes for irritation and swelling   If you have been instructed to have an in-person evaluation today at a local Urgent Care facility, please use the link below. It will take you to a list of all of our available [facility], including address, phone number and hours of operation. Please do not delay care.  [facility]  If you or a family member do not have a primary care provider, use the link below to schedule a visit and establish care. When you choose a [facility] primary care physician or advanced practice provider, you gain a long-term partner in health. Find a Primary Care Provider  Learn more about [facility]'s in-office and virtual care options: [facility] - Get  Care Now

## 2021-08-23 NOTE — Progress Notes (Signed)
I have spent 5 minutes in review of e-visit questionnaire, review and updating patient chart, medical decision making and response to patient.  ° °Rogue Pautler W Breklyn Fabrizio, NP ° °  °

## 2021-09-15 ENCOUNTER — Ambulatory Visit: Payer: BC Managed Care – PPO | Admitting: Medical

## 2022-04-17 ENCOUNTER — Other Ambulatory Visit: Payer: Self-pay | Admitting: Family

## 2022-04-17 DIAGNOSIS — H1013 Acute atopic conjunctivitis, bilateral: Secondary | ICD-10-CM

## 2022-04-27 ENCOUNTER — Other Ambulatory Visit: Payer: Self-pay | Admitting: Family

## 2022-04-27 MED ORDER — NORGESTIMATE-ETH ESTRADIOL 0.25-35 MG-MCG PO TABS: 1.0000 | ORAL_TABLET | Freq: Every day | ORAL | 0 refills | Status: DC

## 2022-04-30 ENCOUNTER — Other Ambulatory Visit: Payer: Self-pay | Admitting: Family

## 2022-04-30 MED ORDER — PREDNISONE 20 MG PO TABS: 60.0000 mg | ORAL_TABLET | Freq: Every day | ORAL | 0 refills | Status: DC

## 2024-02-18 ENCOUNTER — Encounter: Payer: Self-pay | Admitting: Internal Medicine

## 2024-02-18 ENCOUNTER — Ambulatory Visit: Payer: PRIVATE HEALTH INSURANCE | Admitting: Internal Medicine

## 2024-02-18 ENCOUNTER — Telehealth: Payer: Self-pay

## 2024-02-18 VITALS — BP 124/86 | HR 89 | Temp 98.2°F | Resp 20 | Ht 65.4 in | Wt 174.0 lb

## 2024-02-18 DIAGNOSIS — T781XXD Other adverse food reactions, not elsewhere classified, subsequent encounter: Secondary | ICD-10-CM | POA: Diagnosis not present

## 2024-02-18 DIAGNOSIS — L309 Dermatitis, unspecified: Secondary | ICD-10-CM

## 2024-02-18 DIAGNOSIS — J301 Allergic rhinitis due to pollen: Secondary | ICD-10-CM | POA: Diagnosis not present

## 2024-02-18 DIAGNOSIS — K219 Gastro-esophageal reflux disease without esophagitis: Secondary | ICD-10-CM | POA: Insufficient documentation

## 2024-02-18 DIAGNOSIS — T781XXA Other adverse food reactions, not elsewhere classified, initial encounter: Secondary | ICD-10-CM | POA: Insufficient documentation

## 2024-02-18 DIAGNOSIS — L308 Other specified dermatitis: Secondary | ICD-10-CM | POA: Diagnosis not present

## 2024-02-18 DIAGNOSIS — J4520 Mild intermittent asthma, uncomplicated: Secondary | ICD-10-CM | POA: Insufficient documentation

## 2024-02-18 HISTORY — DX: Dermatitis, unspecified: L30.9

## 2024-02-18 MED ORDER — EPINEPHRINE 0.3 MG/0.3ML IJ SOAJ: 0.3000 mg | INTRAMUSCULAR | 1 refills | Status: DC | PRN

## 2024-02-18 MED ORDER — PIMECROLIMUS 1 % EX CREA: TOPICAL_CREAM | Freq: Two times a day (BID) | CUTANEOUS | 3 refills | Status: AC | PRN

## 2024-02-18 MED ORDER — AZELASTINE HCL 0.05 % OP SOLN: 1.0000 [drp] | Freq: Two times a day (BID) | OPHTHALMIC | 5 refills | Status: DC | PRN

## 2024-02-18 MED ORDER — AZELASTINE HCL 0.05 % OP SOLN: 1.0000 [drp] | Freq: Two times a day (BID) | OPHTHALMIC | 5 refills | Status: AC | PRN

## 2024-02-18 MED ORDER — LEVOCETIRIZINE DIHYDROCHLORIDE 5 MG PO TABS: 5.0000 mg | ORAL_TABLET | Freq: Every evening | ORAL | 5 refills | Status: DC

## 2024-02-18 MED ORDER — AZELASTINE-FLUTICASONE 137-50 MCG/ACT NA SUSP: 1.0000 | Freq: Two times a day (BID) | NASAL | 5 refills | Status: DC | PRN

## 2024-02-18 MED ORDER — ALBUTEROL SULFATE HFA 108 (90 BASE) MCG/ACT IN AERS: 2.0000 | INHALATION_SPRAY | Freq: Four times a day (QID) | RESPIRATORY_TRACT | 5 refills | Status: DC | PRN

## 2024-02-18 MED ORDER — AIRSUPRA 90-80 MCG/ACT IN AERO: 2.0000 | INHALATION_SPRAY | RESPIRATORY_TRACT | 3 refills | Status: AC | PRN

## 2024-02-18 MED ORDER — PIMECROLIMUS 1 % EX CREA: TOPICAL_CREAM | Freq: Two times a day (BID) | CUTANEOUS | 3 refills | Status: DC | PRN

## 2024-02-18 MED ORDER — EPINEPHRINE 0.3 MG/0.3ML IJ SOAJ: 0.3000 mg | INTRAMUSCULAR | 1 refills | Status: AC | PRN

## 2024-02-18 MED ORDER — AIRSUPRA 90-80 MCG/ACT IN AERO: 2.0000 | INHALATION_SPRAY | RESPIRATORY_TRACT | 3 refills | Status: DC | PRN

## 2024-02-18 MED ORDER — MONTELUKAST SODIUM 10 MG PO TABS: 10.0000 mg | ORAL_TABLET | Freq: Every day | ORAL | 5 refills | Status: DC

## 2024-02-18 MED ORDER — TACROLIMUS 0.1 % EX OINT: TOPICAL_OINTMENT | CUTANEOUS | 5 refills | Status: AC

## 2024-02-18 MED ORDER — AZELASTINE-FLUTICASONE 137-50 MCG/ACT NA SUSP: 1.0000 | Freq: Two times a day (BID) | NASAL | 5 refills | Status: AC | PRN

## 2024-02-18 MED ORDER — ALBUTEROL SULFATE HFA 108 (90 BASE) MCG/ACT IN AERS: 2.0000 | INHALATION_SPRAY | Freq: Four times a day (QID) | RESPIRATORY_TRACT | 5 refills | Status: AC | PRN

## 2024-02-18 NOTE — Patient Instructions (Addendum)
 Allergic rhinitis Chronic allergic rhinitis with persistent symptoms inadequately controlled by current medications. Previous montelukast  use was beneficial. Considering levocetirizine for better control. - Consider restarting montelukast  10 mg . - Switch to levocetirizine 5 mg daily - start optivar  eye drops 1 drop in each eye twice daily as needed. - start dymista  1 spray in each nostril twice daily as needed  Asthma Asthma with recent exacerbation requiring frequent albuterol  use. Prednisone  use indicates need for improved control. Plan to switch to Airsupra inhaler for enhanced management. - Switch to Airsupra inhaler.  Use as needed for wheezing or shortness of breath.  Maximum 12 puffs/day. - Perform spirometry. - Monitor for daily controller inhaler necessity. - Asthma is not controlled if:  - Symptoms are occurring >2 times a week OR  - >2 times a month nighttime awakenings  - You are requiring systemic steroids (prednisone /steroid injections) more than once per year  - Your require hospitalization for your asthma.  - Please call the clinic to schedule a follow up if these symptoms arise Avoid smoke exposure Stay up-to-date with your annual flu vaccines, COVID vaccines and pneumonia vaccines when indicated.  Eczema Chronic hand eczema with recent flare-up. Patch testing showed sensitivity to neomycin, formaldehyde, polymyxin B , and pramoxine. Environmental triggers suspected. - Continue clobetasol twice daily as needed by dermatology. - can use Elidel 1% twice daily with clobetasol.  Use on top of your clobetasol in the weeks that you are not using clobetasol can use twice daily as needed as a nonsteroid option.  Once hands are improved can use twice weekly for prevention. --Continue Dupixent injections  (managed by dermatology)q - Avoid neomycin, formaldehyde, polymyxin B , and pramoxine. - Verify nail salon products for formaldehyde. - Consider allergy injections to help remove  environmental triggers..  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 Reactions to cherries, pineapple, strawberries, kiwi, and tangerines, which cause symptoms such as an itchy throat, swelling, and itching of the mouth and ears. She notes that she can tolerate processed forms of some fruits, such as maraschino cherries, without symptomsAllergy injections may reduce symptoms. - Prescribe EpiPen. - Schedule allergy testing on Feb 24, 2024. - Avoid almonds, continue other nuts unless symptoms develop. - prescribe epipen as needed for severe allergy symptoms (see action plan); if using call 911  Gastroesophageal reflux disease (GERD) GERD well-controlled with pantoprazole.  Follow-up Follow-up for allergy testing and management. (1-55, TN, cherries, pineapple, strawberries, kiwi, and tangerines) - Schedule follow-up on Feb 24, 2024 at 10:30 AM. - Discontinue Dymista  and levocetirizine three days prior to testing.     It was a pleasure meeting you in clinic today! Thank you for allowing me to participate in your care.

## 2024-02-18 NOTE — Telephone Encounter (Signed)
 Protopic sent in to pharmacy per Dr. Cornel Diesel for pt to try for at least 30 day.

## 2024-02-18 NOTE — Addendum Note (Signed)
 Addended by: Vicenta Graft on: 02/18/2024 05:12 PM   Modules accepted: Orders

## 2024-02-18 NOTE — Progress Notes (Signed)
 NEW PATIENT Date of Service/Encounter:  02/18/24 Referring provider: none-self referred Primary care provider: Kandyce Ort, MD  Subjective:  Samantha Hartman is a 38 y.o. female with a PMHx of migraines presenting today for evaluation of allergic rhinitis, asthma, atopic dermatitis, pollen food allergy syndrome, acid reflux. History obtained from: chart review and patient.   Discussed the use of AI scribe software for clinical note transcription with the patient, who gave verbal consent to proceed.  History of Present Illness   Samantha CASTETTER is a 38 year old female with allergies and asthma who presents with concerns about food allergies and eczema.  She experiences persistent allergy symptoms, including itching eyes, sneezing, runny nose, and an itchy throat, which occur year-round. These symptoms are not adequately controlled with her current medication regimen, which includes cetirizine and azelastine  nasal spray. She has previously tried Flonase  without success and has a history of using montelukast  and levocetirizine.  She has a history of eczema, primarily affecting her hands, which has not responded to her usual treatment regimen of topical clobetasol and Dupixent. She suspects that her recent increased consumption of almonds may be contributing to her symptoms, as she experienced severe throat itching and shortness of breath shortly after consuming them. She has been on Dupixent for about a year, and while it initially improved her eczema, her symptoms have flared in the past three weeks.  She has a history of food allergies, including reactions to cherries, pineapple, strawberries, kiwi, and tangerines, which cause symptoms such as an itchy throat, swelling, and itching of the mouth and ears. She notes that she can tolerate processed forms of some fruits, such as maraschino cherries, without symptoms. She has not previously had issues with almonds, making this a new  concern.  Her asthma was diagnosed in infancy, and she currently uses albuterol  weekly, although she had not needed it for years prior. She was hospitalized for asthma as a child and has been on daily inhalers in the past, including Flovent . She received prednisone  for asthma exacerbations, most recently two months ago, and reports that her asthma symptoms include coughing, wheezing, and chest pressure, often triggered by allergies, pollen, weather changes, exercise, and illness.  Her eczema management includes avoiding known allergens identified through patch testing, such as neomycin, formaldehyde, polymyxin B , and pramoxine. She uses the [facility] app to ensure products are free of these allergens. Despite these precautions, her eczema has flared recently, and she is concerned about potential triggers from environmental allergens or foods.      Chart Review:  2018-Dr. Padgett: seen from allergic rhinitis, asthma, eczema and pollen food allergy syndrome 2018 Allergy testing: Environmental skin prick testing positive for weeds, trees, dust mite  Food skin prick testing is negative for apple, strawberry and pineapple  Contact dermatitis: patch testing 2024: + Pramoxine hydrochloride, Formaldehyde,  Neomycin Sulfate   Other allergy screening: Food allergy: no Medication allergy: no Hymenoptera allergy: no Urticaria: no Eczema:no History of recurrent infections suggestive of immunodeficency: no Vaccinations are up to date.   Past Medical History: Past Medical History:  Diagnosis Date   Angio-edema    Asthma    Breast mass    COVID    May of 2022, sore throat for few days. 06/20/2021   GERD (gastroesophageal reflux disease)    Heart murmur    DX in 2018, doesnt take any antibiotics before procedures or surgery. 06/20/2021   Migraines    Wears glasses    Medication List:  Current  Outpatient Medications  Medication Sig Dispense Refill   Clobetasol Prop Emollient Base 0.05 %  emollient cream      dupilumab (DUPIXENT) 300 MG/2ML prefilled syringe      hydrochlorothiazide (HYDRODIURIL) 12.5 MG tablet Take 12.5 mg by mouth.     norethindrone-ethinyl estradiol -FE (LOESTRIN FE) 1-20 MG-MCG tablet Take by mouth.     pantoprazole (PROTONIX) 40 MG tablet Take 40 mg by mouth.     Pantoprazole Sodium (INVESTIGATIONAL PANTOPRAZOLE) 40 MG tablet Alliance [id number]      Ubrogepant 100 MG TABS Take 1 pill at onset of headache. May repeat in 2 hours if needed but no more than 2 in hours.     albuterol  (VENTOLIN  HFA) 108 (90 Base) MCG/ACT inhaler Inhale 2 puffs into the lungs every 6 (six) hours as needed for wheezing or shortness of breath. 1 each 5   Albuterol -Budesonide (AIRSUPRA) 90-80 MCG/ACT AERO Inhale 2 puffs into the lungs as needed (maximum 12 puffs/day). 10.7 g 3   azelastine  (OPTIVAR ) 0.05 % ophthalmic solution Place 1 drop into both eyes 2 (two) times daily as needed. 6 mL 5   Azelastine -Fluticasone  (DYMISTA ) 137-50 MCG/ACT SUSP Place 1 spray into both nostrils 2 (two) times daily as needed. 23 g 5   EPINEPHrine (EPIPEN 2-PAK) 0.3 mg/0.3 mL IJ SOAJ injection Inject 0.3 mg into the muscle as needed for anaphylaxis. 2 each 1   levocetirizine (XYZAL ) 5 MG tablet Take 1 tablet (5 mg total) by mouth every evening. 30 tablet 5   montelukast  (SINGULAIR ) 10 MG tablet Take 1 tablet (10 mg total) by mouth at bedtime. 32 tablet 5   pimecrolimus (ELIDEL) 1 % cream Apply topically 2 (two) times daily as needed. Once hands controlled, use twice per week for prevention. 100 g 3   No current facility-administered medications for this visit.   Known Allergies:  Allergies  Allergen Reactions   Percocet [Oxycodone -Acetaminophen ] Itching   Past Surgical History: Past Surgical History:  Procedure Laterality Date   APPENDECTOMY  April 2016   LAPAROSCOPIC TUBAL LIGATION Bilateral 12/19/2015   Procedure: LAPAROSCOPIC BILATERAL TUBAL LIGATION;  Surgeon: Merryl Abraham, MD;  Location: [facility]  [place];  Service: Gynecology;  Laterality: Bilateral;   LAPAROSCOPY N/A 06/24/2021   Procedure: LAPAROSCOPY DIAGNOSTIC, FULGERATION OF ENDOMETRIOSIS;  Surgeon: Merryl Abraham, MD;  [facility];  Service: Gynecology;  Laterality: N/A;   Family History: Family History  Problem Relation Age of Onset   Stroke Mother 28   Hypertension Mother    Prostate cancer Father            Diabetes Mellitus II Father    Asthma Father    Asthma Maternal Grandfather    Breast cancer Maternal Aunt    Allergic rhinitis Neg Hx    Angioedema Neg Hx    Atopy Neg Hx    Eczema Neg Hx    Immunodeficiency Neg Hx    Urticaria Neg Hx    Social History: [place] lives in a house without water damage, vinyl flooring, electric heating, central AC, no pets, using dust mite covers on the bed but not pillows, no smoke exposure.  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  [place]   ROS:  All other systems negative except as noted per HPI.  Objective:  Blood pressure 124/86, pulse 89, temperature 98.2 F (36.8 C), temperature source Oral, resp. rate 20, height 5' 5.4" (1.661 m), weight 174 lb (78.9 kg), SpO2  100%. Body mass index is 28.6 kg/m. Physical Exam:  General Appearance:  Alert, cooperative, no distress, appears stated age  Head:  Normocephalic, without obvious abnormality, atraumatic  Eyes:  Conjunctiva clear, EOM's intact  Ears EACs normal bilaterally and normal TMs bilaterally  Nose: Nares normal, hypertrophic turbinates, normal mucosa, and no visible anterior polyps  Throat: Lips, tongue normal; teeth and gums normal, normal posterior oropharynx  Neck: Supple, symmetrical  Lungs:   clear to auscultation bilaterally, Respirations unlabored, no coughing  Heart:  regular rate and rhythm and no murmur, Appears well perfused  Extremities: No edema  Skin: erythematous, dry patches scattered on bilateral hands   Neurologic: No gross deficits   Diagnostics: Spirometry:  Tracings reviewed. Her effort: Good reproducible efforts. FVC: 3.0L  FEV1: 2.45L, 81% predicted  FEV1/FVC ratio: 0.82  Interpretation: Spirometry consistent with normal pattern.  Please see scanned spirometry results for details.  Labs:  Lab Orders  No laboratory test(s) ordered today     Assessment and Plan  Assessment and Plan Assessment and Plan    Allergic rhinitis Chronic allergic rhinitis with persistent symptoms inadequately controlled by current medications. Previous montelukast  use was beneficial. Considering levocetirizine for better control. - Consider restarting montelukast  10 mg . - Switch to levocetirizine 5 mg daily - start optivar  eye drops 1 drop in each eye twice daily as needed. - start dymista  1 spray in each nostril twice daily as needed  Asthma Asthma with recent exacerbation requiring frequent albuterol  use. Prednisone  use indicates need for improved control. Plan to switch to Airsupra inhaler for enhanced management. - Switch to Airsupra inhaler.  Use as needed for wheezing or shortness of breath.  Maximum 12 puffs/day. - Perform spirometry. Looked great. - Monitor for daily controller inhaler necessity. - Asthma is not controlled if:  - Symptoms are occurring >2 times a week OR  - >2 times a month nighttime awakenings  - You are requiring systemic steroids (prednisone /steroid injections) more than once per year  - Your require hospitalization for your asthma.  - Please call the clinic to schedule a follow up if these symptoms arise Avoid smoke exposure Stay up-to-date with your annual flu vaccines, COVID vaccines and pneumonia vaccines when indicated.  Eczema Chronic hand eczema with recent flare-up. Patch testing showed sensitivity to neomycin, formaldehyde, polymyxin B , and pramoxine. Environmental triggers suspected. - Continue clobetasol twice daily as needed by dermatology. - Avoid  neomycin, formaldehyde, polymyxin B , and pramoxine. - Verify nail salon products for formaldehyde. - Consider allergy injections to help remove environmental triggers..  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 Reactions to cherries, pineapple, strawberries, kiwi, and tangerines, which cause symptoms such as an itchy throat, swelling, and itching of the mouth and ears. She notes that she can tolerate processed forms of some fruits, such as maraschino cherries, without symptomsAllergy injections may reduce symptoms. - Prescribe EpiPen. - Schedule allergy testing on Feb 24, 2024. - Avoid almonds, continue other nuts unless symptoms develop. - prescribe epipen as needed for severe allergy symptoms (see action plan); if using call 911  Gastroesophageal reflux disease (GERD) GERD well-controlled with pantoprazole.  Follow-up Follow-up for allergy testing and management. (1-55, TN, cherries, pineapple, strawberries, kiwi, and tangerines) - Schedule follow-up on Feb 24, 2024 at 10:30 AM. - Discontinue Dymista  and levocetirizine three days prior to testing.     It was a pleasure meeting you in clinic today! Thank you for allowing me to participate in your care.  Jonathon Neighbors, MD [facility]    This note in its entirety was forwarded to the Provider who requested this consultation.  Other: samples provided of: cetaphil, vanicream  Thank you for your kind referral. I appreciate the opportunity to take part in Alaysha's care. Please do not hesitate to contact me with questions.  Sincerely,  Jonathon Neighbors, MD [facility] 

## 2024-02-18 NOTE — Telephone Encounter (Signed)
 Pharmacy Patient Advocate Encounter  Received notification from [facility] that Prior Authorization for Pimecrolimus 1% cream  has been DENIED.  Full denial letter will be uploaded to the media tab. See denial reason below.  The denial was based on our criteria for PIMECROLIMUS. Topical Immunomodulators ST - Elidel: Per your health plan's criteria, this drug is covered if you meet the following: (1) The requested drug is being used for a Food and Drug Administration (FDA)-approved  indication. (2) You have tried at least a 30-day supply of or cannot use generic tacrolimus ointment. The information provided does not show that you meet the criteria listed above

## 2024-02-18 NOTE — Telephone Encounter (Signed)
 Yes please

## 2024-02-18 NOTE — Telephone Encounter (Signed)
*  Asthma/Allergy  Pharmacy Patient Advocate Encounter   Received notification from CoverMyMeds that prior authorization for Pimecrolimus 1% cream  is required/requested.   Insurance verification completed.   The patient is insured through [facility] .   Per test claim: PA required; PA submitted to above mentioned insurance via CoverMyMeds Key/confirmation #/EOC [id number] Status is pending

## 2024-02-18 NOTE — Telephone Encounter (Signed)
 Protopic sent in to pharmacy, and pt notify.

## 2024-02-24 ENCOUNTER — Ambulatory Visit (INDEPENDENT_AMBULATORY_CARE_PROVIDER_SITE_OTHER): Payer: PRIVATE HEALTH INSURANCE | Admitting: Internal Medicine

## 2024-02-24 ENCOUNTER — Encounter: Payer: Self-pay | Admitting: Internal Medicine

## 2024-02-24 DIAGNOSIS — J301 Allergic rhinitis due to pollen: Secondary | ICD-10-CM | POA: Diagnosis not present

## 2024-02-24 DIAGNOSIS — L299 Pruritus, unspecified: Secondary | ICD-10-CM | POA: Diagnosis not present

## 2024-02-24 NOTE — Patient Instructions (Addendum)
 Seasonal and Perennial Allergic rhinitis Chronic allergic rhinitis with persistent symptoms inadequately controlled by current medications. Previous montelukast  use was beneficial. Considering levocetirizine for better control. - Consider restarting montelukast  10 mg . - Switch to levocetirizine 5 mg daily - start optivar  eye drops 1 drop in each eye twice daily as needed. - start dymista  1 spray in each nostril twice daily as needed -- Allergy testing today positive to grass pollen, weed pollen, tree pollen, indoor and outdoor molds, dust mite and dog.  Intradermal's positive to grass mix, ragweed, cat and cockroach. Allergen avoidance. -Consider allergy injections to reduce lifetime symptoms and need for medications by teaching your immune system to become tolerant of the environmental allergens you are allergic to  Asthma Asthma with recent exacerbation requiring frequent albuterol  use. Prednisone  use indicates need for improved control. Plan to switch to Airsupra inhaler for enhanced management. - Switch to Airsupra inhaler.  Use as needed for wheezing or shortness of breath.  Maximum 12 puffs/day. - Spirometry was normal 02/18/24. - Monitor for daily controller inhaler necessity. - Asthma is not controlled if:  - Symptoms are occurring >2 times a week OR  - >2 times a month nighttime awakenings  - You are requiring systemic steroids (prednisone /steroid injections) more than once per year  - Your require hospitalization for your asthma.  - Please call the clinic to schedule a follow up if these symptoms arise Avoid smoke exposure Stay up-to-date with your annual flu vaccines, COVID vaccines and pneumonia vaccines when indicated.  Eczema Chronic hand eczema with recent flare-up. Patch testing showed sensitivity to neomycin, formaldehyde, polymyxin B , and pramoxine. Environmental triggers suspected. - Continue clobetasol twice daily as needed by dermatology. - can use Elidel 1% twice  daily with clobetasol.  Use on top of your clobetasol in the weeks that you are not using clobetasol can use twice daily as needed as a nonsteroid option.  Once hands are improved can use twice weekly for prevention. --Continue Dupixent injections  (managed by dermatology)q - Avoid neomycin, formaldehyde, polymyxin B , and pramoxine. - Verify nail salon products for formaldehyde. - Consider allergy injections to help remove environmental triggers..  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 Reactions to cherries, pineapple, strawberries, kiwi, and tangerines, which cause symptoms such as an itchy throat, swelling, and itching of the mouth and ears. She notes that she can tolerate processed forms of some fruits, such as maraschino cherries, without symptomsAllergy injections may reduce symptoms. - Prescribe EpiPen. Instructed on use. - prescribe epipen as needed for severe allergy symptoms (see action plan); if using call 911 - allergy testing today was positive to cherry, hazelnut, pecan, and orange. - would avoid these fruits raw, avoid tree nuts; additional labs for nuts - allergy injections may help with some of these symptoms  Gastroesophageal reflux disease (GERD) GERD well-controlled with pantoprazole.  Follow-up 3 months, sooner if needed. Call sooner if decides on allergy injections. Let us  know if RUSH or regular.     It was a pleasure seeing you again in clinic today! Thank you for allowing me to participate in your care.   Reducing Pollen Exposure  The American Academy of Allergy, Asthma and Immunology suggests the following steps to reduce your exposure to pollen during allergy seasons.    Do not hang sheets or clothing out to dry; pollen may collect on these items. Do not mow lawns or spend time around freshly cut grass; mowing stirs up pollen. Keep  windows closed at night.  Keep car windows closed while  driving. Minimize morning activities outdoors, a time when pollen counts are usually at their highest. Stay indoors as much as possible when pollen counts or humidity is high and on windy days when pollen tends to remain in the air longer. Use air conditioning when possible.  Many air conditioners have filters that trap the pollen spores. Use a HEPA room air filter to remove pollen form the indoor air you breathe. DUST MITE AVOIDANCE MEASURES:  There are three main measures that need and can be taken to avoid house dust mites:  Reduce accumulation of dust in general -reduce furniture, clothing, carpeting, books, stuffed animals, especially in bedroom  Separate yourself from the dust -use pillow and mattress encasements (can be found at stores such as [employer] or online) -avoid direct exposure to air condition flow -use a HEPA filter device, especially in the bedroom; you can also use a HEPA filter vacuum cleaner -wipe dust with a moist towel instead of a dry towel or broom when cleaning  Decrease mites and/or their secretions -wash clothing and linen and stuffed animals at highest temperature possible, at least every 2 weeks -stuffed animals can also be placed in a bag and put in a freezer overnight  Despite the above measures, it is impossible to eliminate dust mites or their allergen completely from your home.  With the above measures the burden of mites in your home can be diminished, with the goal of minimizing your allergic symptoms.  Success will be reached only when implementing and using all means together. Control of Dog or Cat Allergen  Avoidance is the best way to manage a dog or cat allergy. If you have a dog or cat and are allergic to dog or cats, consider removing the dog or cat from the home. If you have a dog or cat but don't want to find it a new home, or if your family wants a pet even though someone in the household is allergic, here are some strategies that  may help keep symptoms at bay:  Keep the pet out of your bedroom and restrict it to only a few rooms. Be advised that keeping the dog or cat in only one room will not limit the allergens to that room. Don't pet, hug or kiss the dog or cat; if you do, wash your hands with soap and water. High-efficiency particulate air (HEPA) cleaners run continuously in a bedroom or living room can reduce allergen levels over time. Regular use of a high-efficiency vacuum cleaner or a central vacuum can reduce allergen levels. Giving your dog or cat a bath at least once a week can reduce airborne allergen. Control of Cockroach Allergen  Cockroach allergen has been identified as an important cause of acute attacks of asthma, especially in urban settings.  There are fifty-five species of cockroach that exist in the [place] , however only three, the [place], [place] and [place] species produce allergen that can affect patients with Asthma.  Allergens can be obtained from fecal particles, egg casings and secretions from cockroaches.    Remove food sources. Reduce access to water. Seal access and entry points. Spray runways with 0.5-1% Diazinon or Chlorpyrifos Blow boric acid power under stoves and refrigerator. Place bait stations (hydramethylnon) at feeding sites. Control of Mold Allergen   Mold and fungi can grow on a variety of surfaces provided certain temperature and moisture conditions exist.  Outdoor molds grow on plants,  decaying vegetation and soil.  The major outdoor mold, Alternaria and Cladosporium, are found in very high numbers during hot and dry conditions.  Generally, a late Summer - Fall peak is seen for common outdoor fungal spores.  Rain will temporarily lower outdoor mold spore count, but counts rise rapidly when the rainy period ends.  The most important indoor molds are Aspergillus and Penicillium.  Dark, humid and poorly ventilated basements are ideal sites for mold growth.  The next most  common sites of mold growth are the bathroom and the kitchen.  Outdoor (Seasonal) Mold Control  Use air conditioning and keep windows closed Avoid exposure to decaying vegetation. Avoid leaf raking. Avoid grain handling. Consider wearing a face mask if working in moldy areas.    Indoor (Perennial) Mold Control   Maintain humidity below 50%. Clean washable surfaces with 5% bleach solution. Remove sources e.g. contaminated carpets.

## 2024-02-24 NOTE — Progress Notes (Signed)
 Date of Service/Encounter:  02/24/24  Allergy testing appointment   Initial visit on 02/18/24, seen for allergic rhinitis, asthma, eczema, GERD.  Please see that note for additional details.  Today reports for allergy diagnostic testing:    DIAGNOSTICS:  Skin Testing: Environmental allergy panel and select foods. Adequate positive and negative controls. Results discussed with patient/family.  Airborne Adult Perc - 02/24/24 1034     Time Antigen Placed 1034    Allergen Manufacturer Floyd Hutchinson    Location Back    Number of Test 55    1. Control-Buffer 50% Glycerol Negative    2. Control-Histamine 3+    3. [place]ia 2+    4. [place] 2+    5. Johnson 2+    6. [place]  Blue Negative    7. Meadow Fescue Negative    8. Perennial Rye Negative    9. Timothy Negative    10. Ragweed Mix Negative    11. Cocklebur Negative    12. Plantain,  English 3+    13. Baccharis 3+    14. Dog Fennel Negative    15. Russian Thistle Negative    16. Lamb's Quarters Negative    17. Sheep Sorrell Negative    18. Rough Pigweed 2+    19. Marsh Elder, Rough Negative    20. Mugwort, Common Negative    21. Box, Elder 3+    22. Cedar, red Negative    23. Sweet Gum 3+    24. Pecan Pollen 3+    25. Pine Mix 3+    26. Walnut, Black Pollen 2+    27. Red Mulberry 3+    28. Ash Mix 3+    29. Birch Mix 3+    30. Beech American 4+    31. Cottonwood, [place] Negative    32. Hickory, White 3+    33. Maple Mix 2+    34. Oak, [place] Mix 3+    35. Sycamore Eastern Negative    36. Alternaria Alternata 3+    37. Cladosporium Herbarum 3+    38. Aspergillus Mix Negative    39. Penicillium Mix 2+    40. Bipolaris Sorokiniana (Helminthosporium) Negative    41. Drechslera Spicifera (Curvularia) 2+    42. Mucor Plumbeus Negative    43. Fusarium Moniliforme Negative    44. Aureobasidium Pullulans (pullulara) Negative    45. Rhizopus Oryzae Negative    46. Botrytis Cinera Negative    47. Epicoccum Nigrum 2+     48. Phoma Betae Negative    49. Dust Mite Mix 4+    50. Cat Hair 10,000 BAU/ml Negative    51.  Dog Epithelia 2+    52. Mixed Feathers Negative    53. Horse Epithelia Negative    54. Cockroach, German Negative    55. Tobacco Leaf Negative             Intradermal - 02/24/24 1105     Time Antigen Placed 1105    Allergen Manufacturer Other    Location Arm    Number of Test 6    Control Negative    7 Grass 3+    Ragweed Mix 3+    Mold 4 Negative    Cat 3+    Cockroach 3+             Food Adult Perc - 02/24/24 1000     Time Antigen Placed 1033    Allergen Manufacturer Floyd Hutchinson    Location Back    Number of  allergen test 14    5. Milk, Cow Negative    10. Cashew Negative    11. Walnut Food Negative    12. Almond Negative    13. Hazelnut --   10X15   14. Pecan Food --   6X10   15. Pistachio Negative    16. [place] Nut Negative    17. Coconut Negative    54. Grape (White seedless) Negative    55. Orange  --   6X15   56. Lemon Negative    60. Strawberry Negative    62. Cherry --   5X23   65. Pineapple Negative             Allergy testing results were read and interpreted by myself, documented by clinical staff.  Patient provided with copy of allergy testing along with avoidance measures when indicated.   Jonathon Neighbors, MD  [facility] 

## 2024-02-26 LAB — IGE NUT PROF. W/COMPONENT RFLX

## 2024-02-28 ENCOUNTER — Ambulatory Visit: Payer: Self-pay | Admitting: Internal Medicine

## 2024-02-28 LAB — IGE NUT PROF. W/COMPONENT RFLX
F017-IgE Hazelnut (Filbert): 9.41 kU/L — AB
F203-IgE Pistachio Nut: 0.15 kU/L — AB
Peanut, IgE: 0.19 kU/L — AB

## 2024-02-28 LAB — PANEL 604726
Cor A 1 IgE: 15.1 kU/L — AB
Cor A 14 IgE: <0.1 kU/L
Cor A 8 IgE: <0.1 kU/L
Cor A 9 IgE: <0.1 kU/L

## 2024-02-28 LAB — ALLERGEN COMPONENT COMMENTS

## 2024-02-28 NOTE — Progress Notes (Signed)
 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 I would still avoid hazelnut because a severe reaction could still occur, especially if eating it raw.  I would also avoid almonds since this gave her symptoms.

## 2024-03-01 NOTE — Telephone Encounter (Signed)
 Samantha Hartman can we call her to get her scheduled for RUSH?

## 2024-03-02 ENCOUNTER — Other Ambulatory Visit: Payer: Self-pay | Admitting: Internal Medicine

## 2024-03-02 DIAGNOSIS — J302 Other seasonal allergic rhinitis: Secondary | ICD-10-CM | POA: Insufficient documentation

## 2024-03-02 NOTE — Progress Notes (Signed)
 RUSH AIT Rx ready.

## 2024-03-03 NOTE — Progress Notes (Signed)
 VIALS WILL BE MADE CLOSER TO APPT.

## 2024-03-03 NOTE — Progress Notes (Signed)
 Aeroallergen Immunotherapy  Ordering Provider: Dr. Jonathon Neighbors  Patient Details Name: Samantha Hartman MRN: [id number] Date of Birth: Jun 03, 1986  Order 1 of 2  Vial Label: G/W/T/Dog  0.3 ml (Volume)  BAU Concentration -- 7 Grass Mix* 100,000 ([place]  Blue, [place], [place], Perennial Rye, RedTop, Sweet Vernal, Timothy) 0.2 ml (Volume)  1:20 Concentration -- Bahia 0.3 ml (Volume)  BAU Concentration -- [place] 10,000 0.2 ml (Volume)  1:20 Concentration -- Johnson 0.3 ml (Volume)  1:20 Concentration -- Ragweed Mix 0.2 ml (Volume)  1:10 Concentration -- Plantain English 0.2 ml (Volume)  1:20 Concentration -- Rough Pigweed* 0.2 ml (Volume)  1:40 Concentration -- Baccharis 0.5 ml (Volume)  1:20 Concentration -- Eastern 10 Tree Mix (also Sweet Gum) 0.2 ml (Volume)  1:20 Concentration -- Box Elder 0.2 ml (Volume)  1:10 Concentration -- Pecan Pollen 0.2 ml (Volume)  1:10 Concentration -- Pine Mix 0.2 ml (Volume)  1:20 Concentration -- Red Mulberry 0.2 ml (Volume)  1:20 Concentration -- Walnut, Black Pollen 0.5 ml (Volume)  1:10 Concentration -- Dog Epithelia   3.9  ml Extract Subtotal 1.1  ml Diluent  5.0  ml Maintenance Total  Schedule:  RUSH Silver Vial (1:1,000,000): RUSH Blue Vial (1:100,000): RUSH Yellow Vial (1:10,000): RUSH Green Vial (1:1,000): Schedule B (6 doses) Red Vial (1:100): Schedule A (14 doses)  Special Instructions: RUSH, green B, red A, then maintenance protocol After completion of the first Red Vial, please space to every two weeks. After completion of the second Red Vial, please space to every 4 weeks. Ok to up dose new vials at 0.53mL --> 0.3 mL --> 0.5 mL. Ok to come twice weekly in green vial, if desired, as long as there is 48 hours between injections.

## 2024-03-03 NOTE — Progress Notes (Signed)
 Aeroallergen Immunotherapy  Ordering Provider: Dr. Jonathon Neighbors  Patient Details Name: Samantha Hartman MRN: [id number] Date of Birth: 09/09/1986  Order 2 of 2  Vial Label: M/DM/C/CR   0.2 ml (Volume)  1:20 Concentration -- Alternaria alternata 0.2 ml (Volume)  1:20 Concentration -- Cladosporium herbarum 0.2 ml (Volume)  1:10 Concentration -- Penicillium mix 0.2 ml (Volume)  1:20 Concentration -- Drechslera spicifera 0.2 ml (Volume)  1:40 Concentration -- Epicoccum nigrum 0.5 ml (Volume)  1:10 Concentration -- Cat Hair 0.3 ml (Volume)  1:20 Concentration -- Cockroach, German 0.5 ml (Volume)   AU Concentration -- Mite Mix (DF 5,000 & DP 5,000)   2.3  ml Extract Subtotal 2.7  ml Diluent  5.0  ml Maintenance Total  Schedule:  RUSH Silver Vial (1:1,000,000): RUSH Blue Vial (1:100,000): RUSH Yellow Vial (1:10,000): RUSH Green Vial (1:1,000): Schedule B (6 doses) Red Vial (1:100): Schedule A (14 doses)  Special Instructions: RUSH, green B, red A, then maintenance protocol After completion of the first Red Vial, please space to every two weeks. After completion of the second Red Vial, please space to every 4 weeks. Ok to up dose new vials at 0.84mL --> 0.3 mL --> 0.5 mL. Ok to come twice weekly in green vial, if desired, as long as there is 48 hours between injections.

## 2024-05-02 DIAGNOSIS — J3081 Allergic rhinitis due to animal (cat) (dog) hair and dander: Secondary | ICD-10-CM | POA: Diagnosis not present

## 2024-05-02 NOTE — Progress Notes (Signed)
 VIALS MADE 05-02-24

## 2024-05-03 DIAGNOSIS — J3089 Other allergic rhinitis: Secondary | ICD-10-CM | POA: Diagnosis not present

## 2024-05-24 ENCOUNTER — Telehealth: Payer: Self-pay

## 2024-05-24 MED ORDER — FAMOTIDINE 20 MG PO TABS: ORAL_TABLET | ORAL | 0 refills | Status: AC

## 2024-05-24 MED ORDER — PREDNISONE 20 MG PO TABS: ORAL_TABLET | ORAL | 0 refills | Status: AC

## 2024-05-24 NOTE — Telephone Encounter (Signed)
 Called pt left a detailed VM, about RUSH and pre-meds.

## 2024-05-26 ENCOUNTER — Ambulatory Visit (INDEPENDENT_AMBULATORY_CARE_PROVIDER_SITE_OTHER): Payer: PRIVATE HEALTH INSURANCE | Admitting: Internal Medicine

## 2024-05-26 ENCOUNTER — Encounter: Payer: Self-pay | Admitting: Internal Medicine

## 2024-05-26 VITALS — BP 126/84 | HR 90 | Temp 97.7°F | Resp 17

## 2024-05-26 DIAGNOSIS — J302 Other seasonal allergic rhinitis: Secondary | ICD-10-CM

## 2024-05-26 DIAGNOSIS — J3089 Other allergic rhinitis: Secondary | ICD-10-CM | POA: Diagnosis not present

## 2024-05-26 MED ORDER — MONTELUKAST SODIUM 10 MG PO TABS: 10.0000 mg | ORAL_TABLET | Freq: Every day | ORAL | 1 refills | Status: AC

## 2024-05-26 MED ORDER — LEVOCETIRIZINE DIHYDROCHLORIDE 5 MG PO TABS: 5.0000 mg | ORAL_TABLET | Freq: Every evening | ORAL | 1 refills | Status: AC

## 2024-05-26 NOTE — Progress Notes (Signed)
 RAPID DESENSITIZATION Note  RE: Samantha Hartman MRN: [id number] DOB: 1986-01-17 Date of Office Visit: 05/26/2024  Subjective:  Patient presents today for rapid desensitization.  Interval History: Patient has not been ill, she has taken all premedications as per protocol.  Recent/Current History: Pulmonary disease: no Cardiac disease: no Respiratory infection: no Rash: no Itch: no Swelling: no Cough: no Shortness of breath: no Runny/stuffy nose: no Itchy eyes: no Beta-blocker use: no  Patient/guardian was informed of the procedure with verbalized understanding of the risk of anaphylaxis. Consent has been signed.   Medication List:  Current Outpatient Medications  Medication Sig Dispense Refill   albuterol  (VENTOLIN  HFA) 108 (90 Base) MCG/ACT inhaler Inhale 2 puffs into the lungs every 6 (six) hours as needed for wheezing or shortness of breath. 1 each 5   Albuterol -Budesonide (AIRSUPRA ) 90-80 MCG/ACT AERO Inhale 2 puffs into the lungs as needed (maximum 12 puffs/day). 10.7 g 3   azelastine  (OPTIVAR ) 0.05 % ophthalmic solution Place 1 drop into both eyes 2 (two) times daily as needed. 6 mL 5   Azelastine -Fluticasone  (DYMISTA ) 137-50 MCG/ACT SUSP Place 1 spray into both nostrils 2 (two) times daily as needed. 23 g 5   Clobetasol Prop Emollient Base 0.05 % emollient cream      dupilumab (DUPIXENT) 300 MG/2ML prefilled syringe      EPINEPHrine  (EPIPEN  2-PAK) 0.3 mg/0.3 mL IJ SOAJ injection Inject 0.3 mg into the muscle as needed for anaphylaxis. 2 each 1   famotidine  (PEPCID ) 20 MG tablet Take 1 tab twie daily Thursday and Friday before RUSH appt. 4 tablet 0   hydrochlorothiazide (HYDRODIURIL) 12.5 MG tablet Take 12.5 mg by mouth.     levocetirizine (XYZAL ) 5 MG tablet Take 1 tablet (5 mg total) by mouth every evening. 90 tablet 1   montelukast  (SINGULAIR ) 10 MG tablet Take 1 tablet (10 mg total) by mouth at bedtime. 90 tablet 1   norethindrone-ethinyl estradiol -FE (LOESTRIN FE)  1-20 MG-MCG tablet Take by mouth.     pantoprazole (PROTONIX) 40 MG tablet Take 40 mg by mouth.     Pantoprazole Sodium (INVESTIGATIONAL PANTOPRAZOLE) 40 MG tablet [employer] [id number]      pimecrolimus  (ELIDEL ) 1 % cream Apply topically 2 (two) times daily as needed. Once hands controlled, use twice per week for prevention. 100 g 3   predniSONE  (DELTASONE ) 20 MG tablet Take 2 tabs Thursday and Friday morning before RUSH appt. 4 tablet 0   tacrolimus  (PROTOPIC ) 0.1 % ointment Apply topically 2 (two) times daily as needed. Once hands controlled, use twice per week for prevention. 100 g 5   Ubrogepant 100 MG TABS Take 1 pill at onset of headache. May repeat in 2 hours if needed but no more than 2 in hours.     No current facility-administered medications for this visit.   Allergies: Allergies  Allergen Reactions   Percocet [Oxycodone -Acetaminophen ] Itching   I reviewed her past medical history, social history, family history, and environmental history and no significant changes have been reported from her previous visit.  ROS: Negative except as per HPI.  Objective: BP 126/84   Pulse 90   Temp 97.7 F (36.5 C) (Temporal)   Resp 17   SpO2 99%  There is no height or weight on file to calculate BMI.   General Appearance:  Alert, cooperative, no distress, appears stated age  Head:  Normocephalic, without obvious abnormality, atraumatic  Eyes:  Conjunctiva clear, EOM's intact  Nose: Nares normal  Throat: Lips, tongue normal;  teeth and gums normal, normal posterior oropharnyx  Neck: Supple, symmetrical  Lungs:   CTAB, Respirations unlabored, no coughing  Heart:  RRR, no murmur, Appears well perfused  Extremities: No edema  Skin: Skin color, texture, turgor normal, no rashes or lesions on visualized portions of skin  Neurologic: No gross deficits     Diagnostics:  PROCEDURES:  Patient received the following doses every hour: Step 1:  0.51ml - 1:1,000,000 dilution (silver vial) Step  2:  0.71ml - 1:1,000,000 dilution (silver vial) Step 3: 0.1ml - 1:100,000 dilution (blue vial)  Step 4: 0.3ml - 1:100,000 dilution (blue vial)  Step 5: 0.1ml - 1:10,000 dilution (gold vial) Step 6: 0.2ml - 1:10,000 dilution (gold vial) Step 7: 0.20ml - 1:10,000 dilution (gold vial) Step 8: 0.4ml - 1:10,000 dilution (gold vial)  Patient was observed for 1 hour after the last dose.   Procedure started at 8:44 AM Procedure ended at 3:50 PM   ASSESSMENT/PLAN:   Patient has tolerated the rapid desensitization protocol.  Next appointment: Start at 0.05ml of 1:1000 dilution (green vial) and build up per protocol.

## 2024-06-02 ENCOUNTER — Ambulatory Visit (INDEPENDENT_AMBULATORY_CARE_PROVIDER_SITE_OTHER): Payer: PRIVATE HEALTH INSURANCE

## 2024-06-02 DIAGNOSIS — J3089 Other allergic rhinitis: Secondary | ICD-10-CM

## 2024-06-02 DIAGNOSIS — J302 Other seasonal allergic rhinitis: Secondary | ICD-10-CM | POA: Diagnosis not present

## 2024-06-09 ENCOUNTER — Encounter: Payer: Self-pay | Admitting: Internal Medicine

## 2024-06-14 ENCOUNTER — Ambulatory Visit (INDEPENDENT_AMBULATORY_CARE_PROVIDER_SITE_OTHER): Payer: PRIVATE HEALTH INSURANCE

## 2024-06-14 DIAGNOSIS — J309 Allergic rhinitis, unspecified: Secondary | ICD-10-CM

## 2024-06-22 ENCOUNTER — Ambulatory Visit (INDEPENDENT_AMBULATORY_CARE_PROVIDER_SITE_OTHER): Payer: PRIVATE HEALTH INSURANCE

## 2024-06-22 DIAGNOSIS — J309 Allergic rhinitis, unspecified: Secondary | ICD-10-CM

## 2024-06-28 ENCOUNTER — Ambulatory Visit (INDEPENDENT_AMBULATORY_CARE_PROVIDER_SITE_OTHER): Payer: PRIVATE HEALTH INSURANCE

## 2024-06-28 DIAGNOSIS — J309 Allergic rhinitis, unspecified: Secondary | ICD-10-CM

## 2024-07-06 ENCOUNTER — Ambulatory Visit (INDEPENDENT_AMBULATORY_CARE_PROVIDER_SITE_OTHER): Payer: PRIVATE HEALTH INSURANCE

## 2024-07-06 DIAGNOSIS — J309 Allergic rhinitis, unspecified: Secondary | ICD-10-CM

## 2024-07-12 ENCOUNTER — Ambulatory Visit: Payer: Self-pay

## 2024-07-12 DIAGNOSIS — J309 Allergic rhinitis, unspecified: Secondary | ICD-10-CM

## 2024-07-19 ENCOUNTER — Ambulatory Visit (INDEPENDENT_AMBULATORY_CARE_PROVIDER_SITE_OTHER): Payer: PRIVATE HEALTH INSURANCE

## 2024-07-19 DIAGNOSIS — J309 Allergic rhinitis, unspecified: Secondary | ICD-10-CM

## 2024-07-26 ENCOUNTER — Ambulatory Visit (INDEPENDENT_AMBULATORY_CARE_PROVIDER_SITE_OTHER): Payer: PRIVATE HEALTH INSURANCE

## 2024-07-26 DIAGNOSIS — J309 Allergic rhinitis, unspecified: Secondary | ICD-10-CM

## 2024-08-02 ENCOUNTER — Ambulatory Visit: Payer: Self-pay

## 2024-08-02 DIAGNOSIS — J309 Allergic rhinitis, unspecified: Secondary | ICD-10-CM | POA: Diagnosis not present

## 2024-08-09 ENCOUNTER — Ambulatory Visit (INDEPENDENT_AMBULATORY_CARE_PROVIDER_SITE_OTHER): Payer: PRIVATE HEALTH INSURANCE

## 2024-08-09 DIAGNOSIS — J309 Allergic rhinitis, unspecified: Secondary | ICD-10-CM | POA: Diagnosis not present

## 2024-08-16 ENCOUNTER — Ambulatory Visit (INDEPENDENT_AMBULATORY_CARE_PROVIDER_SITE_OTHER): Payer: PRIVATE HEALTH INSURANCE

## 2024-08-16 DIAGNOSIS — J309 Allergic rhinitis, unspecified: Secondary | ICD-10-CM

## 2024-08-23 ENCOUNTER — Ambulatory Visit (INDEPENDENT_AMBULATORY_CARE_PROVIDER_SITE_OTHER): Payer: PRIVATE HEALTH INSURANCE

## 2024-08-23 DIAGNOSIS — J309 Allergic rhinitis, unspecified: Secondary | ICD-10-CM | POA: Diagnosis not present

## 2024-08-30 ENCOUNTER — Ambulatory Visit (INDEPENDENT_AMBULATORY_CARE_PROVIDER_SITE_OTHER): Payer: PRIVATE HEALTH INSURANCE

## 2024-08-30 DIAGNOSIS — J309 Allergic rhinitis, unspecified: Secondary | ICD-10-CM

## 2024-09-13 ENCOUNTER — Ambulatory Visit: Payer: PRIVATE HEALTH INSURANCE

## 2024-09-13 DIAGNOSIS — J309 Allergic rhinitis, unspecified: Secondary | ICD-10-CM

## 2024-09-20 ENCOUNTER — Ambulatory Visit (INDEPENDENT_AMBULATORY_CARE_PROVIDER_SITE_OTHER): Payer: PRIVATE HEALTH INSURANCE

## 2024-09-20 DIAGNOSIS — J309 Allergic rhinitis, unspecified: Secondary | ICD-10-CM | POA: Diagnosis not present

## 2024-10-03 ENCOUNTER — Ambulatory Visit: Payer: PRIVATE HEALTH INSURANCE

## 2024-10-03 DIAGNOSIS — J309 Allergic rhinitis, unspecified: Secondary | ICD-10-CM | POA: Diagnosis not present

## 2024-10-18 ENCOUNTER — Ambulatory Visit

## 2024-10-18 DIAGNOSIS — J302 Other seasonal allergic rhinitis: Secondary | ICD-10-CM | POA: Diagnosis not present

## 2024-11-01 ENCOUNTER — Ambulatory Visit

## 2024-11-01 DIAGNOSIS — J302 Other seasonal allergic rhinitis: Secondary | ICD-10-CM

## 2024-11-01 NOTE — Progress Notes (Signed)
 VIALS MADE ON 11/01/24

## 2024-11-14 ENCOUNTER — Ambulatory Visit

## 2024-11-14 DIAGNOSIS — J302 Other seasonal allergic rhinitis: Secondary | ICD-10-CM | POA: Diagnosis not present
